# Patient Record
Sex: Male | Born: 1935 | Race: White | Hispanic: No | Marital: Married | Smoking: Former smoker
Health system: Southern US, Community
[De-identification: ages and names within clinical notes are randomized; demographics above are authoritative.]

## PROBLEM LIST (undated history)

## (undated) DIAGNOSIS — G629 Polyneuropathy, unspecified: Secondary | ICD-10-CM

## (undated) DIAGNOSIS — I10 Essential (primary) hypertension: Secondary | ICD-10-CM

## (undated) DIAGNOSIS — E119 Type 2 diabetes mellitus without complications: Secondary | ICD-10-CM

## (undated) HISTORY — PX: CATARACT EXTRACTION, BILATERAL: SHX1313

---

## 2012-08-20 ENCOUNTER — Ambulatory Visit: Payer: Self-pay | Admitting: Ophthalmology

## 2012-09-10 ENCOUNTER — Ambulatory Visit: Payer: Self-pay | Admitting: Ophthalmology

## 2016-11-29 DIAGNOSIS — I208 Other forms of angina pectoris: Secondary | ICD-10-CM | POA: Insufficient documentation

## 2016-11-29 DIAGNOSIS — E782 Mixed hyperlipidemia: Secondary | ICD-10-CM | POA: Insufficient documentation

## 2016-11-29 DIAGNOSIS — I10 Essential (primary) hypertension: Secondary | ICD-10-CM | POA: Insufficient documentation

## 2016-12-02 ENCOUNTER — Emergency Department: Payer: Medicare Other

## 2016-12-02 ENCOUNTER — Observation Stay: Payer: Medicare Other

## 2016-12-02 ENCOUNTER — Encounter: Payer: Self-pay | Admitting: Emergency Medicine

## 2016-12-02 ENCOUNTER — Observation Stay
Admission: EM | Admit: 2016-12-02 | Discharge: 2016-12-03 | Disposition: A | Discharge status: Home / Self Care | Payer: Medicare Other | Attending: Internal Medicine | Admitting: Internal Medicine

## 2016-12-02 DIAGNOSIS — J90 Pleural effusion, not elsewhere classified: Secondary | ICD-10-CM | POA: Insufficient documentation

## 2016-12-02 DIAGNOSIS — Z794 Long term (current) use of insulin: Secondary | ICD-10-CM | POA: Diagnosis not present

## 2016-12-02 DIAGNOSIS — Z79899 Other long term (current) drug therapy: Secondary | ICD-10-CM | POA: Insufficient documentation

## 2016-12-02 DIAGNOSIS — N281 Cyst of kidney, acquired: Secondary | ICD-10-CM | POA: Diagnosis not present

## 2016-12-02 DIAGNOSIS — K4020 Bilateral inguinal hernia, without obstruction or gangrene, not specified as recurrent: Secondary | ICD-10-CM | POA: Insufficient documentation

## 2016-12-02 DIAGNOSIS — C7970 Secondary malignant neoplasm of unspecified adrenal gland: Secondary | ICD-10-CM | POA: Diagnosis not present

## 2016-12-02 DIAGNOSIS — R0602 Shortness of breath: Secondary | ICD-10-CM | POA: Diagnosis present

## 2016-12-02 DIAGNOSIS — E43 Unspecified severe protein-calorie malnutrition: Secondary | ICD-10-CM | POA: Diagnosis not present

## 2016-12-02 DIAGNOSIS — C787 Secondary malignant neoplasm of liver and intrahepatic bile duct: Principal | ICD-10-CM | POA: Insufficient documentation

## 2016-12-02 DIAGNOSIS — I700 Atherosclerosis of aorta: Secondary | ICD-10-CM | POA: Diagnosis not present

## 2016-12-02 DIAGNOSIS — C7951 Secondary malignant neoplasm of bone: Secondary | ICD-10-CM | POA: Insufficient documentation

## 2016-12-02 DIAGNOSIS — Z7982 Long term (current) use of aspirin: Secondary | ICD-10-CM | POA: Diagnosis not present

## 2016-12-02 DIAGNOSIS — R5383 Other fatigue: Secondary | ICD-10-CM

## 2016-12-02 DIAGNOSIS — K5730 Diverticulosis of large intestine without perforation or abscess without bleeding: Secondary | ICD-10-CM | POA: Diagnosis not present

## 2016-12-02 DIAGNOSIS — N400 Enlarged prostate without lower urinary tract symptoms: Secondary | ICD-10-CM | POA: Insufficient documentation

## 2016-12-02 DIAGNOSIS — I119 Hypertensive heart disease without heart failure: Secondary | ICD-10-CM | POA: Insufficient documentation

## 2016-12-02 DIAGNOSIS — R0789 Other chest pain: Secondary | ICD-10-CM

## 2016-12-02 DIAGNOSIS — E1142 Type 2 diabetes mellitus with diabetic polyneuropathy: Secondary | ICD-10-CM | POA: Insufficient documentation

## 2016-12-02 DIAGNOSIS — Z87891 Personal history of nicotine dependence: Secondary | ICD-10-CM | POA: Diagnosis not present

## 2016-12-02 DIAGNOSIS — R142 Eructation: Secondary | ICD-10-CM

## 2016-12-02 DIAGNOSIS — R160 Hepatomegaly, not elsewhere classified: Secondary | ICD-10-CM

## 2016-12-02 DIAGNOSIS — R531 Weakness: Secondary | ICD-10-CM

## 2016-12-02 DIAGNOSIS — K219 Gastro-esophageal reflux disease without esophagitis: Secondary | ICD-10-CM | POA: Diagnosis not present

## 2016-12-02 DIAGNOSIS — R109 Unspecified abdominal pain: Secondary | ICD-10-CM

## 2016-12-02 HISTORY — DX: Type 2 diabetes mellitus without complications: E11.9

## 2016-12-02 HISTORY — DX: Essential (primary) hypertension: I10

## 2016-12-02 HISTORY — DX: Polyneuropathy, unspecified: G62.9

## 2016-12-02 LAB — COMPREHENSIVE METABOLIC PANEL
ALK PHOS: 55 U/L (ref 38–126)
ALT: 18 U/L (ref 17–63)
ANION GAP: 8 (ref 5–15)
AST: 26 U/L (ref 15–41)
Albumin: 3.3 g/dL — ABNORMAL LOW (ref 3.5–5.0)
BILIRUBIN TOTAL: 0.6 mg/dL (ref 0.3–1.2)
BUN: 40 mg/dL — ABNORMAL HIGH (ref 6–20)
CALCIUM: 9 mg/dL (ref 8.9–10.3)
CO2: 26 mmol/L (ref 22–32)
CREATININE: 1.72 mg/dL — AB (ref 0.61–1.24)
Chloride: 105 mmol/L (ref 101–111)
GFR, EST AFRICAN AMERICAN: 41 mL/min — AB (ref >60)
GFR, EST NON AFRICAN AMERICAN: 36 mL/min — AB (ref >60)
Glucose, Bld: 189 mg/dL — ABNORMAL HIGH (ref 65–99)
Potassium: 3.4 mmol/L — ABNORMAL LOW (ref 3.5–5.1)
SODIUM: 139 mmol/L (ref 135–145)
TOTAL PROTEIN: 6.7 g/dL (ref 6.5–8.1)

## 2016-12-02 LAB — GLUCOSE, CAPILLARY
GLUCOSE-CAPILLARY: 135 mg/dL — AB (ref 65–99)
GLUCOSE-CAPILLARY: 182 mg/dL — AB (ref 65–99)

## 2016-12-02 LAB — URINALYSIS, COMPLETE (UACMP) WITH MICROSCOPIC
Bacteria, UA: NONE SEEN
Bilirubin Urine: NEGATIVE
Glucose, UA: NEGATIVE mg/dL
Hgb urine dipstick: NEGATIVE
KETONES UR: NEGATIVE mg/dL
Nitrite: NEGATIVE
PH: 5 (ref 5.0–8.0)
PROTEIN: NEGATIVE mg/dL
SQUAMOUS EPITHELIAL / LPF: NONE SEEN
Specific Gravity, Urine: 1.036 — ABNORMAL HIGH (ref 1.005–1.030)

## 2016-12-02 LAB — CBC
HEMATOCRIT: 28.3 % — AB (ref 40.0–52.0)
HEMOGLOBIN: 9.7 g/dL — AB (ref 13.0–18.0)
MCH: 30.3 pg (ref 26.0–34.0)
MCHC: 34.4 g/dL (ref 32.0–36.0)
MCV: 88.3 fL (ref 80.0–100.0)
Platelets: 230 10*3/uL (ref 150–440)
RBC: 3.21 MIL/uL — AB (ref 4.40–5.90)
RDW: 13.1 % (ref 11.5–14.5)
WBC: 12.1 10*3/uL — ABNORMAL HIGH (ref 3.8–10.6)

## 2016-12-02 LAB — BRAIN NATRIURETIC PEPTIDE: B Natriuretic Peptide: 122 pg/mL — ABNORMAL HIGH (ref 0.0–100.0)

## 2016-12-02 LAB — LIPASE, BLOOD: Lipase: 86 U/L — ABNORMAL HIGH (ref 11–51)

## 2016-12-02 LAB — TROPONIN I

## 2016-12-02 MED ORDER — PANTOPRAZOLE SODIUM 40 MG PO TBEC
40.0000 mg | DELAYED_RELEASE_TABLET | Freq: Every day | ORAL | Status: DC
  Administered 2016-12-03: 09:00:00 40 mg via ORAL
  Filled 2016-12-02: qty 1

## 2016-12-02 MED ORDER — SODIUM CHLORIDE 0.9 % IV SOLN
Freq: Once | INTRAVENOUS | Status: AC
  Administered 2016-12-02: 17:00:00 via INTRAVENOUS

## 2016-12-02 MED ORDER — IOPAMIDOL (ISOVUE-300) INJECTION 61%
75.0000 mL | Freq: Once | INTRAVENOUS | Status: AC | PRN
  Administered 2016-12-02: 18:00:00 75 mL via INTRAVENOUS

## 2016-12-02 MED ORDER — GABAPENTIN 600 MG PO TABS
600.0000 mg | ORAL_TABLET | Freq: Three times a day (TID) | ORAL | Status: DC | PRN
  Administered 2016-12-02 – 2016-12-03 (×3): 600 mg via ORAL
  Filled 2016-12-02 (×3): qty 1

## 2016-12-02 MED ORDER — ONDANSETRON HCL 4 MG/2ML IJ SOLN: 4.0000 mg | Freq: Four times a day (QID) | INTRAMUSCULAR | Status: DC | PRN

## 2016-12-02 MED ORDER — IOPAMIDOL (ISOVUE-300) INJECTION 61%
15.0000 mL | INTRAVENOUS | Status: AC
  Administered 2016-12-02: 15 mL via ORAL

## 2016-12-02 MED ORDER — ATORVASTATIN CALCIUM 20 MG PO TABS: 10.0000 mg | ORAL_TABLET | Freq: Every evening | ORAL | Status: DC

## 2016-12-02 MED ORDER — ENOXAPARIN SODIUM 40 MG/0.4ML ~~LOC~~ SOLN
40.0000 mg | SUBCUTANEOUS | Status: DC
  Administered 2016-12-02: 22:00:00 40 mg via SUBCUTANEOUS
  Filled 2016-12-02: qty 0.4

## 2016-12-02 MED ORDER — ONDANSETRON HCL 4 MG PO TABS: 4.0000 mg | ORAL_TABLET | Freq: Four times a day (QID) | ORAL | Status: DC | PRN

## 2016-12-02 MED ORDER — SODIUM CHLORIDE 0.9% FLUSH
3.0000 mL | Freq: Two times a day (BID) | INTRAVENOUS | Status: DC
  Administered 2016-12-02 – 2016-12-03 (×2): 3 mL via INTRAVENOUS

## 2016-12-02 MED ORDER — ACETAMINOPHEN 325 MG PO TABS: 650.0000 mg | ORAL_TABLET | Freq: Four times a day (QID) | ORAL | Status: DC | PRN

## 2016-12-02 MED ORDER — INSULIN ASPART 100 UNIT/ML ~~LOC~~ SOLN
0.0000 [IU] | Freq: Three times a day (TID) | SUBCUTANEOUS | Status: DC
  Administered 2016-12-02: 1 [IU] via SUBCUTANEOUS
  Administered 2016-12-03 (×2): 2 [IU] via SUBCUTANEOUS
  Filled 2016-12-02 (×3): qty 1

## 2016-12-02 MED ORDER — POLYETHYLENE GLYCOL 3350 17 G PO PACK: 17.0000 g | PACK | Freq: Every day | ORAL | Status: DC | PRN

## 2016-12-02 MED ORDER — SODIUM CHLORIDE 0.9% FLUSH: 3.0000 mL | INTRAVENOUS | Status: DC | PRN

## 2016-12-02 MED ORDER — ASPIRIN 81 MG PO CHEW
81.0000 mg | CHEWABLE_TABLET | Freq: Every day | ORAL | Status: DC
  Administered 2016-12-02 – 2016-12-03 (×2): 81 mg via ORAL
  Filled 2016-12-02 (×2): qty 1

## 2016-12-02 MED ORDER — ACETAMINOPHEN 650 MG RE SUPP: 650.0000 mg | Freq: Four times a day (QID) | RECTAL | Status: DC | PRN

## 2016-12-02 NOTE — H&P (Signed)
[facility] NAME: Keith Chandler    MR#:  [id number]  DATE OF BIRTH:  [birth year]  DATE OF ADMISSION:  12/02/2016  PRIMARY CARE PHYSICIAN: [facility] [place]   REQUESTING/REFERRING PHYSICIAN: Dr Jacqualine Code  CHIEF COMPLAINT:  Chest pressure with gas feeling in the epigastric area and significant amount of flatus, loss of appetite weight loss 15 pounds over one month  HISTORY OF PRESENT ILLNESS:  Keith Chandler  is a 82 y.o. male with a known history of  Dm-2, HTN and history of peripheral neuropathy comes to the emergency room with feeling of epigastric discomfort due to "gas" and significant amount of flatus with weight loss 15 pounds loss of appetite and generalized weakness for past 1 month. Patient was recently seen by cardiology Dr. Nehemiah Massed per family and his blood pressure medications have been adjusted since his blood pressure is staying on the lower side. Patient denies any chest pain. Denies any nausea or vomiting. In the emergency room patient underwent ultrasound of the abdomen shows multiple enhancing lesions in the liver consistent with possible metastatic lesions.  Patient is being admitted for further evaluation of management.  PAST MEDICAL HISTORY:   Past Medical History:  Diagnosis Date  . Diabetes mellitus without complication ([place])   . Hypertension   . Neuropathy     PAST SURGICAL HISTOIRY:  History reviewed. No pertinent surgical history.  SOCIAL HISTORY:   Social History  Substance Use Topics  . Smoking status: Former Research scientist (life sciences)  . Smokeless tobacco: Never Used  . Alcohol use No    FAMILY HISTORY:  No family history on file.  DRUG ALLERGIES:   Allergies  Allergen Reactions  . Sulfa Antibiotics     REVIEW OF SYSTEMS:  Review of Systems  Constitutional: Positive for weight loss. Negative for chills and fever.  HENT: Negative for ear discharge, ear pain and nosebleeds.   Eyes: Negative  for blurred vision, pain and discharge.  Respiratory: Negative for sputum production, shortness of breath, wheezing and stridor.   Cardiovascular: Negative for chest pain, palpitations, orthopnea and PND.       Bloating and chest pressure with belching  Gastrointestinal: Negative for abdominal pain, diarrhea, nausea and vomiting.  Genitourinary: Negative for frequency and urgency.  Musculoskeletal: Negative for back pain and joint pain.  Neurological: Positive for weakness. Negative for sensory change, speech change and focal weakness.  Psychiatric/Behavioral: Negative for depression and hallucinations. The patient is not nervous/anxious.      MEDICATIONS AT HOME:   Prior to Admission medications   Medication Sig Start Date End Date Taking? Authorizing Provider  aspirin EC 81 MG tablet Take 81 mg by mouth daily.   Yes [provider]  atorvastatin (LIPITOR) 10 MG tablet Take 10 mg by mouth daily.   Yes [provider]  gabapentin (NEURONTIN) 600 MG tablet Take 600 mg by mouth 3 (three) times daily as needed.   Yes [provider]  omeprazole (PRILOSEC) 20 MG capsule Take 20 mg by mouth daily.   Yes [provider]  triamterene-hydrochlorothiazide (DYAZIDE) 37.5-25 MG capsule Take 1 capsule by mouth daily.   Yes [provider]      VITAL SIGNS:  Blood pressure (!) 107/50, pulse 98, temperature 98.6 F (37 C), temperature source Oral, resp. rate 18, height 5\' 6"  (1.676 m), weight 83 kg (183 lb), SpO2 92 %.  PHYSICAL EXAMINATION:  GENERAL:  82 y.o.-year-old patient lying in the bed with  no acute distress.  EYES: Pupils equal, round, reactive to light and accommodation. No scleral icterus. Extraocular muscles intact.  HEENT: Head atraumatic, normocephalic. Oropharynx and nasopharynx clear.  NECK:  Supple, no jugular venous distention. No thyroid enlargement, no tenderness.  LUNGS: Normal breath sounds bilaterally, no wheezing, rales,rhonchi or  crepitation. No use of accessory muscles of respiration.  CARDIOVASCULAR: S1, S2 normal. No murmurs, rubs, or gallops.  ABDOMEN: Soft, nontender, nondistended. Bowel sounds present. No organomegaly or mass.  EXTREMITIES: No pedal edema, cyanosis, or clubbing.  NEUROLOGIC: Cranial nerves II through XII are intact. Muscle strength 5/5 in all extremities. Sensation intact. Gait not checked.  PSYCHIATRIC: The patient is alert and oriented x 3.  SKIN: No obvious rash, lesion, or ulcer.   LABORATORY PANEL:   CBC  Recent Labs Lab 12/02/16 1242  WBC 12.1*  HGB 9.7*  HCT 28.3*  PLT 230   ------------------------------------------------------------------------------------------------------------------  Chemistries   Recent Labs Lab 12/02/16 1242  NA 139  K 3.4*  CL 105  CO2 26  GLUCOSE 189*  BUN 40*  CREATININE 1.72*  CALCIUM 9.0  AST 26  ALT 18  ALKPHOS 55  BILITOT 0.6   ------------------------------------------------------------------------------------------------------------------  Cardiac Enzymes  Recent Labs Lab 12/02/16 1242  TROPONINI <0.03   ------------------------------------------------------------------------------------------------------------------  RADIOLOGY:  Dg Chest 2 View  Result Date: 12/02/2016 CLINICAL DATA:  Fatigue, weakness, chest pressure and cough EXAM: CHEST  2 VIEW COMPARISON:  None. FINDINGS: Mild cardiomegaly with vascular congestion. Chronic nonspecific interstitial prominence without definite edema. No significant effusion or pneumothorax. No focal pneumonia, collapse or consolidation. Trachea is midline. Atherosclerosis of aorta. Degenerative changes of the spine. No compression fracture. IMPRESSION: Cardiomegaly with vascular congestion Mild chronic interstitial changes suspected. No definite superimposed CHF or pneumonia. Electronically Signed   By: Jerilynn Mages.  Shick M.D.   On: 12/02/2016 12:51   US Abdomen Limited Ruq  Result Date:  12/02/2016 CLINICAL DATA:  Abdominal pain and belching for the past month. 15 pound weight loss over the past month. Ex-smoker. EXAM: ULTRASOUND ABDOMEN LIMITED RIGHT UPPER QUADRANT COMPARISON:  None. FINDINGS: Gallbladder: No gallstones or wall thickening visualized. No sonographic Murphy sign noted by sonographer. Common bile duct: Diameter: 3.7 mm Liver: Multiple rounded and oval hypoechoic masses within the liver. The largest mass measures 1.5 cm in maximum diameter. IMPRESSION: Multiple liver masses, most likely representing metastatic disease. Otherwise, normal examination. Electronically Signed   By: Claudie Revering M.D.   On: 12/02/2016 14:31    EKG:   Sinus rhythm with possible old inferior wall ST-T changes  IMPRESSION AND PLAN:   Keith Chandler  is a 82 y.o. male with a known history of  Dm-2, HTN and history of peripheral neuropathy comes to the emergency room with feeling of epigastric discomfort due to "gas" and significant amount of flatus with weight loss 15 pounds loss of appetite and generalized weakness for past 1 month.  1. Generalized weakness, loss of appetite, weight loss 15 pounds along with epigastric discomfort and abnormal ultrasound abdomen with metastatic liver lesions worrisome for possible malignancy origin unknown -Admit for observation -Aspirin 81 mg daily. -CT abdomen and pelvis and CT chest with contrast -IV fluids  2. Chest pressure with gaseous feeling in the epigastric area worrisome for GERD, rule out MI -Troponin 3 -Aspirin 81 mg daily -Protonix 40 mg daily -When necessary Maalox -Consider GI workup as outpatient if symptoms do not improve  3. Type 2 diabetes diet-controlled -Sliding-scale insulin  4. Hypertension -Blood pressure stable hold BP  meds  5. Hyperlipidemia -On Lipitor  6. DVT prophylaxis subcutaneous Lovenox  Discussed with patient's children in the emergency room All the records are reviewed and case discussed with ED  provider. Management plans discussed with the patient, family and they are in agreement.  CODE STATUS: Full  TOTAL TIME TAKING CARE OF THIS PATIENT: 50 minutes.    Smith Potenza M.D on 12/02/2016 at 3:26 PM  Between 7am to 6pm - Pager - [phone]  After 6pm go to [email or web address] - password [email or web address] [facility]  [facility]  Office  [phone]  CC: Primary care physician; [facility]

## 2016-12-02 NOTE — Consult Note (Signed)
[facility] Consultation Note  Patient ID: Keith Chandler, MRN: [id number], DOB/AGE: 1934-10-07 82 y.o. Admit date: 12/02/2016   Date of Consult: 12/02/2016 Primary Physician: [facility] [place] Primary Cardiologist: Nehemiah Massed  Chief Complaint:  Chief Complaint  Patient presents with  . Fatigue  . Cough   Reason for Consult: chest pain weakness fatigue  HPI: 82 y.o. male with known diabetes without complication essential hypertension mixed hyperlipidemia who is had progressive shortness of breath weakness fatigue and unable to do the things he wishes. He's had an chest x-ray which showed some pleural effusions and some apparent nodules of unknown etiology. He did have some blood work showing slight elevation of white blood cell count but no other abnormalities. EKG shows no normal sinus rhythm. He does have this chest discomfort which is nonspecific in nature and is atypical for coronary artery disease in true angina. The patient is a normal troponin and no evidence of myocardial infarction. In addition to that to further evaluation of his chest and abdomen by CAT scan shows significant extensive lytic lesions of the spine lungs mediastinum and abdomen consistent with metastatic cancer. An no apparent significant cardiac involvement  Past Medical History:  Diagnosis Date  . Diabetes mellitus without complication ([place])   . Hypertension   . Neuropathy       Surgical History: History reviewed. No pertinent surgical history.   Home Meds: Prior to Admission medications   Medication Sig Start Date End Date Taking? Authorizing Provider  aspirin EC 81 MG tablet Take 81 mg by mouth daily.   Yes [provider]  atorvastatin (LIPITOR) 10 MG tablet Take 10 mg by mouth daily.   Yes [provider]  gabapentin (NEURONTIN) 600 MG tablet Take 600 mg by mouth 3 (three) times daily as needed.   Yes [provider]  omeprazole (PRILOSEC) 20 MG capsule  Take 20 mg by mouth daily.   Yes [provider]  triamterene-hydrochlorothiazide (DYAZIDE) 37.5-25 MG capsule Take 1 capsule by mouth daily.   Yes [provider]    Inpatient Medications:  . aspirin  81 mg Oral Daily  . atorvastatin  10 mg Oral QPM  . enoxaparin (LOVENOX) injection  40 mg Subcutaneous Q24H  . insulin aspart  0-9 Units Subcutaneous TID WC  . pantoprazole  40 mg Oral Daily  . sodium chloride flush  3 mL Intravenous Q12H     Allergies:  Allergies  Allergen Reactions  . Sulfa Antibiotics     Social History   Social History  . Marital status: Married    Spouse name: N/A  . Number of children: N/A  . Years of education: N/A   Occupational History  . Not on file.   Social History Main Topics  . Smoking status: Former Research scientist (life sciences)  . Smokeless tobacco: Never Used  . Alcohol use No  . Drug use: No  . Sexual activity: Not on file   Other Topics Concern  . Not on file   Social History Narrative  . No narrative on file     No family history on file.   Review of Systems Positive for weakness fatigue weight changes Negative for: General:  chills, fever, night sweats or positive for weight changes.  Cardiovascular: PND orthopnea syncope dizziness  Dermatological skin lesions rashes Respiratory: Cough congestion Urologic: Frequent urination urination at night and hematuria Abdominal: negative for nausea, vomiting, diarrhea, bright red blood per rectum, melena, or hematemesis Neurologic: negative for visual changes, and/or  hearing changes  All other systems reviewed and are otherwise negative except as noted above.  Labs:  Recent Labs  12/02/16 1242  TROPONINI <0.03   Lab Results  Component Value Date   WBC 12.1 (H) 12/02/2016   HGB 9.7 (L) 12/02/2016   HCT 28.3 (L) 12/02/2016   MCV 88.3 12/02/2016   PLT 230 12/02/2016    Recent Labs Lab 12/02/16 1242  NA 139  K 3.4*  CL 105  CO2 26  BUN 40*  CREATININE 1.72*  CALCIUM  9.0  PROT 6.7  BILITOT 0.6  ALKPHOS 55  ALT 18  AST 26  GLUCOSE 189*   No results found for: CHOL, HDL, LDLCALC, TRIG No results found for: DDIMER  Radiology/Studies:  Dg Chest 2 View  Result Date: 12/02/2016 CLINICAL DATA:  Fatigue, weakness, chest pressure and cough EXAM: CHEST  2 VIEW COMPARISON:  None. FINDINGS: Mild cardiomegaly with vascular congestion. Chronic nonspecific interstitial prominence without definite edema. No significant effusion or pneumothorax. No focal pneumonia, collapse or consolidation. Trachea is midline. Atherosclerosis of aorta. Degenerative changes of the spine. No compression fracture. IMPRESSION: Cardiomegaly with vascular congestion Mild chronic interstitial changes suspected. No definite superimposed CHF or pneumonia. Electronically Signed   By: Jerilynn Mages.  Shick M.D.   On: 12/02/2016 12:51   Ct Chest W Contrast  Result Date: 12/02/2016 CLINICAL DATA:  Abnormal ultrasound showing liver masses, epigastric discomfort due to gas, lays, 15 pounds weight loss, loss of appetite, generalized weakness, type II diabetes mellitus, hypertension, former smoker EXAM: CT CHEST, ABDOMEN, AND PELVIS WITH CONTRAST TECHNIQUE: Multidetector CT imaging of the chest, abdomen and pelvis was performed following the standard protocol during bolus administration of intravenous contrast. Sagittal and coronal MPR images reconstructed from axial data set. CONTRAST:  51mL ISOVUE-300 IOPAMIDOL (ISOVUE-300) INJECTION 61% IV. Dilute oral contrast. COMPARISON:  Abdominal ultrasound 12/02/2016 FINDINGS: CT CHEST FINDINGS Cardiovascular: Atherosclerotic calcifications aorta, coronary arteries, proximal great vessels. Aorta normal caliber. Small pericardial effusion. Mild LEFT ventricular dilatation. Displacement and compression of the IVC by mediastinal adenopathy the remains patent. Mediastinum/Nodes: Moderate-sized hiatal hernia question mild wall thickening of the midesophagus. Multiple enlarged  mediastinal lymph nodes including a conglomerate nodal mass at the AP window 3.3 x 4.6 cm, pretracheal node 16 mm image 21, prevascular node 16 mm image 16, RIGHT superior mediastinal node 21 mm image 14, and necrotic subcarinal node 29 mm image 28. 17 mm RIGHT hilar node image 31. RIGHT superior hilar node 16 mm image 27. Adenopathy extends throughout the superior mediastinum on the RIGHT, displacing and compressing the IVC and extending to the base of the RIGHT cervical region. No axillary adenopathy. Unable to assess patency of the RIGHT subclavian vein. Lungs/Pleura: Small BILATERAL pleural effusions. Abnormal soft tissue/tumor surrounds the RIGHT and LEFT mainstem bronchi. Underlying emphysematous changes. Dependent atelectasis in the posterior lower lobes. No definite pulmonary mass. 4 mm RIGHT upper lobe nodule image 33. 3 mm RIGHT upper lobe nodule image 63. Calcified granuloma anterior RIGHT lung. Few additional scattered tiny BILATERAL pulmonary nodules. 6 mm LEFT upper lobe nodule image 25. No infiltrate or pneumothorax. Musculoskeletal: No acute osseous lesions. CT ABDOMEN PELVIS FINDINGS Hepatobiliary: Subtle liver lesions question metastases including an 11 mm LEFT lobe lesion image 52, 9 mm RIGHT lobe lesion image 55, LEFT lobe lesion superiorly 8 mm image 50. Gallbladder unremarkable. Pancreas: Mild peripancreatic edema. Peripancreatic adenopathy. No discrete pancreatic mass or ductal dilatation. Spleen: Normal appearance Adrenals/Urinary Tract: BILATERAL adrenal nodules 17 x 12 mm LEFT and 9  x 6 mm RIGHT. LEFT renal cysts largest 4.5 x 3.4 cm image 72. Kidneys, ureters, and bladder otherwise normal appearance. Stomach/Bowel: Scattered stool throughout colon. Sigmoid diverticulosis without evidence of diverticulitis. No definite gastric or bowel mass. Normal appendix. Vascular/Lymphatic: Atherosclerotic calcifications aorta without aneurysm. Extensive adenopathy and mesenteric up to 15 mm short axis  image 78. Peripancreatic, periportal and portal caval adenopathy up to 19 mm short axis image 62 and 16 mm image 56. Reproductive: Significant prostatic enlargement gland measuring 6.1 x 5.4 cm image 113. Other: Small amount of free fluid in pelvis. Significant edema in the mesenteric associated with mesenteric adenopathy. Multiple tumor nodules/implants in retroperitoneal fat in the flanks bilaterally. Possible peritoneal nodules anterior pelvis image 100 and LEFT pelvis image 94. BILATERAL inguinal hernias containing fat. No free air. Musculoskeletal: No acute osseous findings. Lytic metastatic lesions at L2, L3, L5, LEFT iliac. Probable metastatic lesion S2 segment of sacrum. IMPRESSION: Extensive tumor involvement in the chest, abdomen, and pelvis, with extensive adenopathy in the mediastinum and RIGHT hilum, mesenteric, periportal/peripancreatic. Small BILATERAL pulmonary nodules question pulmonary metastases. Lytic metastatic lesions in lumbar spine and pelvis. Small hepatic and adrenal metastases as well as retroperitoneal tumor deposits bilaterally and question of peritoneal deposits in the pelvis. No definite primary tumor is localized. Small BILATERAL pleural effusions. Electronically Signed   By: Lavonia Dana M.D.   On: 12/02/2016 18:27   Ct Abdomen Pelvis W Contrast  Result Date: 12/02/2016 CLINICAL DATA:  Abnormal ultrasound showing liver masses, epigastric discomfort due to gas, lays, 15 pounds weight loss, loss of appetite, generalized weakness, type II diabetes mellitus, hypertension, former smoker EXAM: CT CHEST, ABDOMEN, AND PELVIS WITH CONTRAST TECHNIQUE: Multidetector CT imaging of the chest, abdomen and pelvis was performed following the standard protocol during bolus administration of intravenous contrast. Sagittal and coronal MPR images reconstructed from axial data set. CONTRAST:  23mL ISOVUE-300 IOPAMIDOL (ISOVUE-300) INJECTION 61% IV. Dilute oral contrast. COMPARISON:  Abdominal  ultrasound 12/02/2016 FINDINGS: CT CHEST FINDINGS Cardiovascular: Atherosclerotic calcifications aorta, coronary arteries, proximal great vessels. Aorta normal caliber. Small pericardial effusion. Mild LEFT ventricular dilatation. Displacement and compression of the IVC by mediastinal adenopathy the remains patent. Mediastinum/Nodes: Moderate-sized hiatal hernia question mild wall thickening of the midesophagus. Multiple enlarged mediastinal lymph nodes including a conglomerate nodal mass at the AP window 3.3 x 4.6 cm, pretracheal node 16 mm image 21, prevascular node 16 mm image 16, RIGHT superior mediastinal node 21 mm image 14, and necrotic subcarinal node 29 mm image 28. 17 mm RIGHT hilar node image 31. RIGHT superior hilar node 16 mm image 27. Adenopathy extends throughout the superior mediastinum on the RIGHT, displacing and compressing the IVC and extending to the base of the RIGHT cervical region. No axillary adenopathy. Unable to assess patency of the RIGHT subclavian vein. Lungs/Pleura: Small BILATERAL pleural effusions. Abnormal soft tissue/tumor surrounds the RIGHT and LEFT mainstem bronchi. Underlying emphysematous changes. Dependent atelectasis in the posterior lower lobes. No definite pulmonary mass. 4 mm RIGHT upper lobe nodule image 33. 3 mm RIGHT upper lobe nodule image 63. Calcified granuloma anterior RIGHT lung. Few additional scattered tiny BILATERAL pulmonary nodules. 6 mm LEFT upper lobe nodule image 25. No infiltrate or pneumothorax. Musculoskeletal: No acute osseous lesions. CT ABDOMEN PELVIS FINDINGS Hepatobiliary: Subtle liver lesions question metastases including an 11 mm LEFT lobe lesion image 52, 9 mm RIGHT lobe lesion image 55, LEFT lobe lesion superiorly 8 mm image 50. Gallbladder unremarkable. Pancreas: Mild peripancreatic edema. Peripancreatic adenopathy. No discrete pancreatic mass  or ductal dilatation. Spleen: Normal appearance Adrenals/Urinary Tract: BILATERAL adrenal nodules 17  x 12 mm LEFT and 9 x 6 mm RIGHT. LEFT renal cysts largest 4.5 x 3.4 cm image 72. Kidneys, ureters, and bladder otherwise normal appearance. Stomach/Bowel: Scattered stool throughout colon. Sigmoid diverticulosis without evidence of diverticulitis. No definite gastric or bowel mass. Normal appendix. Vascular/Lymphatic: Atherosclerotic calcifications aorta without aneurysm. Extensive adenopathy and mesenteric up to 15 mm short axis image 78. Peripancreatic, periportal and portal caval adenopathy up to 19 mm short axis image 62 and 16 mm image 56. Reproductive: Significant prostatic enlargement gland measuring 6.1 x 5.4 cm image 113. Other: Small amount of free fluid in pelvis. Significant edema in the mesenteric associated with mesenteric adenopathy. Multiple tumor nodules/implants in retroperitoneal fat in the flanks bilaterally. Possible peritoneal nodules anterior pelvis image 100 and LEFT pelvis image 94. BILATERAL inguinal hernias containing fat. No free air. Musculoskeletal: No acute osseous findings. Lytic metastatic lesions at L2, L3, L5, LEFT iliac. Probable metastatic lesion S2 segment of sacrum. IMPRESSION: Extensive tumor involvement in the chest, abdomen, and pelvis, with extensive adenopathy in the mediastinum and RIGHT hilum, mesenteric, periportal/peripancreatic. Small BILATERAL pulmonary nodules question pulmonary metastases. Lytic metastatic lesions in lumbar spine and pelvis. Small hepatic and adrenal metastases as well as retroperitoneal tumor deposits bilaterally and question of peritoneal deposits in the pelvis. No definite primary tumor is localized. Small BILATERAL pleural effusions. Electronically Signed   By: Lavonia Dana M.D.   On: 12/02/2016 18:27   US Abdomen Limited Ruq  Result Date: 12/02/2016 CLINICAL DATA:  Abdominal pain and belching for the past month. 15 pound weight loss over the past month. Ex-smoker. EXAM: ULTRASOUND ABDOMEN LIMITED RIGHT UPPER QUADRANT COMPARISON:  None.  FINDINGS: Gallbladder: No gallstones or wall thickening visualized. No sonographic Murphy sign noted by sonographer. Common bile duct: Diameter: 3.7 mm Liver: Multiple rounded and oval hypoechoic masses within the liver. The largest mass measures 1.5 cm in maximum diameter. IMPRESSION: Multiple liver masses, most likely representing metastatic disease. Otherwise, normal examination. Electronically Signed   By: Claudie Revering M.D.   On: 12/02/2016 14:31    EKG: Normal sinus rhythm  Weights: Filed Weights   12/02/16 1200 12/02/16 1600  Weight: 83 kg (183 lb) 82.1 kg (181 lb)     Physical Exam: Blood pressure (!) 138/56, pulse 86, temperature 97.9 F (36.6 C), temperature source Oral, resp. rate 18, height 5\' 6"  (1.676 m), weight 82.1 kg (181 lb), SpO2 95 %. Body mass index is 29.21 kg/m. General: Well developed, well nourished, in no acute distress. Head eyes ears nose throat: Normocephalic, atraumatic, sclera non-icteric, no xanthomas, nares are without discharge. No apparent thyromegaly and/or mass  Lungs: Normal respiratory effort. Few wheezes, no rales, some rhonchi.  Heart: RRR with normal S1 S2. no murmur gallop, no rub, PMI is normal size and placement, carotid upstroke normal without bruit, jugular venous pressure is normal Abdomen: Soft, non-tender, non-distended with normoactive bowel sounds. No hepatomegaly. No rebound/guarding. No obvious abdominal masses. Abdominal aorta is normal size without bruit Extremities: No edema. no cyanosis, no clubbing, no ulcers  Peripheral : 2+ bilateral upper extremity pulses, 2+ bilateral femoral pulses, 2+ bilateral dorsal pedal pulse Neuro: Alert and oriented. No facial asymmetry. No focal deficit. Moves all extremities spontaneously. Musculoskeletal: Normal muscle tone without kyphosis Psych:  Responds to questions appropriately with a normal affect.    Assessment: 82 year old male with the essential hypertension mixed hyperlipidemia on a  previous appropriate medication management having nonspecific  chest discomfort with a normal EKG and no evidence of myocardial infarction with extensive changes by CAT scan suggesting metastatic disease  Plan: 1. No further cardiac workup at this time due to atypical symptoms for cardiac disease 2. Continue medication management for hypertension control 3. Further delineation of the type of metastatic disease and treatment options  Signed, Corey Skains M.D. [facility] 12/02/2016, 6:57 PM

## 2016-12-02 NOTE — ED Provider Notes (Signed)
[facility] Emergency Department Provider Note   ____________________________________________   First MD Initiated Contact with Patient 12/02/16 1217     (approximate)  I have reviewed the triage vital signs and the nursing notes.   HISTORY  Chief Complaint Fatigue and Cough    HPI Keith Chandler is a 82 y.o. male who is from about one month now noticed his voice seems slightly hoarse, he is occasionally had a dry cough, but he is also been experiencing episodes of what he describes as a pressure or a gas feeling in the center of his chest. This comes and goes, seems to be getting slightly worse. Not seemingly associated with exertion.  Reports increasing fatigue. Occasionally short of breath  Saw his primary doctor and was placed on 10 days of Levaquin with no improvement. Saw cardiology and was instructed he needed to have an echocardiogram and stress test. Then went to urgent care, reports they told to come emergency room though he is not sure why on Friday night. He came today as he is continuing to have episodes of discomfort in the central chest occasional slight shortness of breath and fatigue.  Currently reports pain and symptom-free  Past Medical History:  Diagnosis Date  . Diabetes mellitus without complication ([place])   . Hypertension   . Neuropathy     Patient Active Problem List   Diagnosis Date Noted  . Weakness 12/02/2016    History reviewed. No pertinent surgical history.  Prior to Admission medications   Medication Sig Start Date End Date Taking? Authorizing Provider  aspirin EC 81 MG tablet Take 81 mg by mouth daily.   Yes [provider]  atorvastatin (LIPITOR) 10 MG tablet Take 10 mg by mouth daily.   Yes [provider]  gabapentin (NEURONTIN) 600 MG tablet Take 600 mg by mouth 3 (three) times daily as needed.   Yes [provider]  omeprazole (PRILOSEC) 20 MG capsule Take 20 mg by mouth daily.   Yes  [provider]  triamterene-hydrochlorothiazide (DYAZIDE) 37.5-25 MG capsule Take 1 capsule by mouth daily.   Yes [provider]    Allergies Sulfa antibiotics  No family history on file.  Social History Social History  Substance Use Topics  . Smoking status: Former Research scientist (life sciences)  . Smokeless tobacco: Never Used  . Alcohol use No    Review of Systems Constitutional: No fever/chillsBut having fatigue Eyes: No visual changes. ENT: No sore throat. Cardiovascular: See history of present illness Respiratory: See history of present illness Gastrointestinal: No abdominal pain.  No nausea, no vomiting.  No diarrhea.  No constipation. Genitourinary: Negative for dysuria. Musculoskeletal: Negative for back pain. Skin: Negative for rash. Neurological: Negative for headaches, focal weakness or numbness.    ____________________________________________   PHYSICAL EXAM:  VITAL SIGNS: ED Triage Vitals  Enc Vitals Group     BP 12/02/16 1203 (!) 107/50     Pulse Rate 12/02/16 1203 98     Resp 12/02/16 1203 18     Temp 12/02/16 1203 98.6 F (37 C)     Temp Source 12/02/16 1203 Oral     SpO2 12/02/16 1203 92 %     Weight 12/02/16 1200 183 lb (83 kg)     Height 12/02/16 1200 5\' 6"  (1.676 m)     Head Circumference --      Peak Flow --      Pain Score --      Pain Loc --  Pain Edu? --      Excl. in [place]? --     Constitutional: Alert and oriented. Well appearing and in no acute distress. Eyes: Conjunctivae are normal. Head: Atraumatic. Nose: No congestion/rhinnorhea. Mouth/Throat: Mucous membranes are moist. Neck: No stridor.   Cardiovascular: Normal rate, regular rhythm. Grossly normal heart sounds.  Good peripheral circulation. Respiratory: Normal respiratory effort.  No retractions. Lungs CTAB. Gastrointestinal: Soft and nontender. No distention. Musculoskeletal: No lower extremity tenderness nor edema. Neurologic:  Normal speech and language. No gross focal  neurologic deficits are appreciated.  Skin:  Skin is warm, dry and intact. No rash noted. Psychiatric: Mood and affect are normal. Speech and behavior are normal.  ____________________________________________   LABS (all labs ordered are listed, but only abnormal results are displayed)  Labs Reviewed  CBC - Abnormal; Notable for the following:       Result Value   WBC 12.1 (*)    RBC 3.21 (*)    Hemoglobin 9.7 (*)    HCT 28.3 (*)    All other components within normal limits  COMPREHENSIVE METABOLIC PANEL - Abnormal; Notable for the following:    Potassium 3.4 (*)    Glucose, Bld 189 (*)    BUN 40 (*)    Creatinine, Ser 1.72 (*)    Albumin 3.3 (*)    GFR calc non Af Amer 36 (*)    GFR calc Af Amer 41 (*)    All other components within normal limits  LIPASE, BLOOD - Abnormal; Notable for the following:    Lipase 86 (*)    All other components within normal limits  BRAIN NATRIURETIC PEPTIDE - Abnormal; Notable for the following:    B Natriuretic Peptide 122.0 (*)    All other components within normal limits  TROPONIN I  URINALYSIS, COMPLETE (UACMP) WITH MICROSCOPIC   ____________________________________________  EKG  Reviewed and interpreted by me at 1220 Heart rate 95 20 QTc 450 Normal sinus rhythm, nonspecific T-wave abnormality noted in inferior and lateral leads including slight depression in V5 and minimal inversion inferior ____________________________________________  RADIOLOGY  Dg Chest 2 View  Result Date: 12/02/2016 CLINICAL DATA:  Fatigue, weakness, chest pressure and cough EXAM: CHEST  2 VIEW COMPARISON:  None. FINDINGS: Mild cardiomegaly with vascular congestion. Chronic nonspecific interstitial prominence without definite edema. No significant effusion or pneumothorax. No focal pneumonia, collapse or consolidation. Trachea is midline. Atherosclerosis of aorta. Degenerative changes of the spine. No compression fracture. IMPRESSION: Cardiomegaly with  vascular congestion Mild chronic interstitial changes suspected. No definite superimposed CHF or pneumonia. Electronically Signed   By: Jerilynn Mages.  Shick M.D.   On: 12/02/2016 12:51   US Abdomen Limited Ruq  Result Date: 12/02/2016 CLINICAL DATA:  Abdominal pain and belching for the past month. 15 pound weight loss over the past month. Ex-smoker. EXAM: ULTRASOUND ABDOMEN LIMITED RIGHT UPPER QUADRANT COMPARISON:  None. FINDINGS: Gallbladder: No gallstones or wall thickening visualized. No sonographic Murphy sign noted by sonographer. Common bile duct: Diameter: 3.7 mm Liver: Multiple rounded and oval hypoechoic masses within the liver. The largest mass measures 1.5 cm in maximum diameter. IMPRESSION: Multiple liver masses, most likely representing metastatic disease. Otherwise, normal examination. Electronically Signed   By: Claudie Revering M.D.   On: 12/02/2016 14:31    ____________________________________________   PROCEDURES  Procedure(s) performed: None  Procedures  Critical Care performed: No  ____________________________________________   INITIAL IMPRESSION / ASSESSMENT AND PLAN / ED COURSE  Pertinent labs & imaging results that were available during  my care of the patient were reviewed by me and considered in my medical decision making (see chart for details).  Patient presents for increasing fatigue, also some associated chest symptoms including fracture or what he describes as a gas bubble feeling coming going with occasional shortness of breath. He had treatment for possible infectious symptoms about 2 weeks ago without improvement after 10 days of Levaquin. Saw cardiology recommended he have a echo and stress test but he is not really scheduled to almost a month later as now patient.  I'm concerned about his fatigue, episodes of shortness of breath and discomfort he is experiencing his chest. He also has forced voice which I am not quite clear on the etiology for. His oropharyngeal exam is  unremarkable  Time concerned given his outpatient evaluations that this could represent atypical presentation of acute coronary syndrome or angina. Does not carry a history of this. EKG does demonstrate T-wave abnormality has risk factors for coronary disease namely age, former smoker.  Heart score = 4 moderate risk.      ----------------------------------------- 4:03 PM on 12/02/2016 -----------------------------------------  Discussed with Dr. Nehemiah Massed. Advises reasonable to admit the patient for further workup at this time given he has attempted outpatient, this is his fourth visit in a week. Will see in consultation in the hospital. In addition, all  reveals concern for possible metastases or malignancy in the liver. Will admit patient for further evaluation and workup.   ____________________________________________   FINAL CLINICAL IMPRESSION(S) / ED DIAGNOSES  Final diagnoses:  Belching  Abdominal pain  Chest discomfort  Fatigue, unspecified type  Liver masses      NEW MEDICATIONS STARTED DURING THIS VISIT:  New Prescriptions   No medications on file     Note:  This document was prepared using Dragon voice recognition software and may include unintentional dictation errors.     Delman Kitten, MD 12/02/16 [phone]

## 2016-12-02 NOTE — ED Triage Notes (Signed)
Arrives with C/O cough x 3 weeks and weakness.  SEen through walk in clinic in early July-- had CXR and treated with Levaquin and tessalon pearls.  Then followed up with PCP, blood pressure was lower than normal.  One BP medication stopped.  Seen by Dr. Gigi Gin Thursday, had blood work-- WBC elevated.  Seen through walk in clinic on Friday, blood work repeated and patient was referred to ED.  Patient did not want to be seen through ED, but today is not feeling better.  Family report that patient has had a 15 pound weight loss over the past month.

## 2016-12-02 NOTE — Progress Notes (Signed)
Chaplain received an Order Requisition that patient requested prayer. Chaplain went and talked with patient and his family. Patient had been busy running test all day and will be running test again tomorrow. Chaplain prayed with patient and his wife, plus his children joined in. Patient just wants to get better so he can go home. Therefore that was our prayer. I gave the patient a prayer cloth.

## 2016-12-03 ENCOUNTER — Observation Stay
Admit: 2016-12-03 | Discharge: 2016-12-03 | Disposition: A | Discharge status: Home / Self Care | Payer: Medicare Other | Attending: Internal Medicine | Admitting: Internal Medicine

## 2016-12-03 ENCOUNTER — Other Ambulatory Visit: Payer: Self-pay | Admitting: Oncology

## 2016-12-03 DIAGNOSIS — Z87891 Personal history of nicotine dependence: Secondary | ICD-10-CM

## 2016-12-03 DIAGNOSIS — R498 Other voice and resonance disorders: Secondary | ICD-10-CM

## 2016-12-03 DIAGNOSIS — C799 Secondary malignant neoplasm of unspecified site: Secondary | ICD-10-CM | POA: Diagnosis not present

## 2016-12-03 DIAGNOSIS — Z794 Long term (current) use of insulin: Secondary | ICD-10-CM

## 2016-12-03 DIAGNOSIS — E119 Type 2 diabetes mellitus without complications: Secondary | ICD-10-CM

## 2016-12-03 DIAGNOSIS — R634 Abnormal weight loss: Secondary | ICD-10-CM | POA: Diagnosis not present

## 2016-12-03 DIAGNOSIS — R630 Anorexia: Secondary | ICD-10-CM | POA: Diagnosis not present

## 2016-12-03 DIAGNOSIS — R079 Chest pain, unspecified: Secondary | ICD-10-CM | POA: Diagnosis not present

## 2016-12-03 DIAGNOSIS — I10 Essential (primary) hypertension: Secondary | ICD-10-CM

## 2016-12-03 DIAGNOSIS — C787 Secondary malignant neoplasm of liver and intrahepatic bile duct: Secondary | ICD-10-CM | POA: Diagnosis not present

## 2016-12-03 DIAGNOSIS — G629 Polyneuropathy, unspecified: Secondary | ICD-10-CM

## 2016-12-03 DIAGNOSIS — C3490 Malignant neoplasm of unspecified part of unspecified bronchus or lung: Secondary | ICD-10-CM

## 2016-12-03 DIAGNOSIS — Z7982 Long term (current) use of aspirin: Secondary | ICD-10-CM

## 2016-12-03 DIAGNOSIS — Z79899 Other long term (current) drug therapy: Secondary | ICD-10-CM | POA: Diagnosis not present

## 2016-12-03 LAB — TROPONIN I

## 2016-12-03 LAB — GLUCOSE, CAPILLARY
Glucose-Capillary: 160 mg/dL — ABNORMAL HIGH (ref 65–99)
Glucose-Capillary: 159 mg/dL — ABNORMAL HIGH (ref 65–99)

## 2016-12-03 MED ORDER — MEGESTROL ACETATE 40 MG PO TABS
40.0000 mg | ORAL_TABLET | Freq: Every day | ORAL | Status: DC
  Administered 2016-12-03: 40 mg via ORAL
  Filled 2016-12-03: qty 1

## 2016-12-03 MED ORDER — MEGESTROL ACETATE 40 MG PO TABS: 40.0000 mg | ORAL_TABLET | Freq: Every day | ORAL | 1 refills | Status: DC

## 2016-12-03 MED ORDER — ENSURE ENLIVE PO LIQD: 237.0000 mL | Freq: Three times a day (TID) | ORAL | 12 refills | Status: DC

## 2016-12-03 MED ORDER — OMEPRAZOLE 20 MG PO CPDR: 20.0000 mg | DELAYED_RELEASE_CAPSULE | Freq: Two times a day (BID) | ORAL | 1 refills | Status: AC

## 2016-12-03 MED ORDER — ENSURE ENLIVE PO LIQD
237.0000 mL | Freq: Three times a day (TID) | ORAL | Status: DC
  Administered 2016-12-03 (×2): 237 mL via ORAL

## 2016-12-03 NOTE — Progress Notes (Signed)
*  PRELIMINARY RESULTS* Echocardiogram 2D Echocardiogram has been performed.  Sherrie Sport 12/03/2016, 3:27 PM

## 2016-12-03 NOTE — Evaluation (Signed)
Physical Therapy Evaluation Patient Details Name: Keith Chandler MRN: [id number] DOB: [birth year] Today's Date: 12/03/2016   History of Present Illness  Keith Chandler  is a 82 y.o. male with a known history of  Dm-2, HTN and history of peripheral neuropathy comes to the emergency room with feeling of epigastric discomfort due to "gas" and significant amount of flatus with weight loss 15 pounds loss of appetite and generalized weakness for past 1 month. [place] in the ED shows multiple lesions in liver. CT of abdomen shows Extensive tumor involvement in the chest, abdomen, and pelvis  Clinical Impression  82 yo male presents to hospital with recent weight loss and fatigue. Patient reports having peripheral neuropathy at baseline with decreased mobility noted over last several months. He denies any recent falls. He reports being independent in all self care ADLs prior to admittance. He was walking without assistive device and was driving. Patient lives in a single story home with 1 step to get to part of his home with a doorway. He lives with his wife who was diagnosed with Alzhemier's disease. She is still doing all the cooking and cleaning and he helps supervise his wife. His son lives nearby and can assist as needed. Patient was up in chair at start of evaluation. He is independent in transfers. He ambulated 175 feet without AD, mod I requiring increased time. He ambulates at 2.22 feet/sec which is less than community ambulator but does not put him at an increased risk for falls. He exhibits good strength with 5/5 in BLE. Patient does not exhibit any need for additional skilled PT intervention. He is currently exercising for 30-60 min each day with stretches and stationary bike. Recommended patient continue with home program and to contact MD if he notices a change in mobility; Patient agreeable.     Follow Up Recommendations No PT follow up    Equipment Recommendations  None recommended by PT     Recommendations for Other Services       Precautions / Restrictions Precautions Precautions: None Restrictions Weight Bearing Restrictions: No      Mobility  Bed Mobility               General bed mobility comments: pt up in chair at start of evaluation, bed mobility not observed;   Transfers Overall transfer level: Independent Equipment used: None             General transfer comment: transfers sit<>Stand from chair with good hand placement and safety awareness;   Ambulation/Gait Ambulation/Gait assistance: Modified independent (Device/Increase time) Ambulation Distance (Feet): 175 Feet Assistive device: None Gait Pattern/deviations: WFL(Within Functional Limits);Decreased dorsiflexion - right;Decreased dorsiflexion - left;Decreased step length - right;Decreased step length - left Gait velocity: 2.22 feet/sec   General Gait Details: ambulates with slightly slower gait speed (2.22 feet/sec) with shorter step length. Patient ambulates without assistive devices with good safety awareness and dynamic balance control.   Stairs            Wheelchair Mobility    Modified Rankin (Stroke Patients Only)       Balance Overall balance assessment: Needs assistance Sitting-balance support: No upper extremity supported;Feet supported Sitting balance-Leahy Scale: Normal     Standing balance support: No upper extremity supported Standing balance-Leahy Scale: Fair Standing balance comment: able to stand with feet together slight sway, immediate loss of balance with eyes closed;          [place] - Eyes Opened: 10 [place] - Eyes Closed: 0  Pertinent Vitals/Pain Pain Assessment: No/denies pain    Home Living Family/patient expects to be discharged to:: Private residence Living Arrangements: Spouse/significant other (spouse has Alzheimers; son is active in helping care for patient and his wife; ) Available Help at Discharge:  Family Type of Home: House Home Access: Stairs to enter Entrance Stairs-Rails: Right;Left;Can reach both Entrance Stairs-Number of Steps: 3 Home Layout: One level;Other (Comment) (has 1, 8 inch step to get to part of home. Has doorway to hold onto for balance; ) Home Equipment: None Additional Comments: son reports that they have access to assistive devices if he needed them but patient does not personally own any equipment;     Prior Function Level of Independence: Independent         Comments: did not use any assistive devices; was driving prior to admittance; independent in bathing/dressing; wife did cooking/cleaning;      Hand Dominance        Extremity/Trunk Assessment   Upper Extremity Assessment Upper Extremity Assessment: Overall WFL for tasks assessed    Lower Extremity Assessment Lower Extremity Assessment: LLE deficits/detail;RLE deficits/detail RLE Deficits / Details: has numbness/tingling in feet from neuropathy; BLE strength 5/5; ROM is WFL LLE Deficits / Details: has numbness/tingling in feet from neuropathy; BLE strength 5/5; ROM is [facility]    Cervical / Trunk Assessment Cervical / Trunk Assessment: Normal  Communication   Communication: No difficulties  Cognition Arousal/Alertness: Awake/alert Behavior During Therapy: WFL for tasks assessed/performed Overall Cognitive Status: Within Functional Limits for tasks assessed                                        General Comments      Exercises Other Exercises Other Exercises: Patient educated on importance of continuing to exercise for fatigue management. He reports that he stretches for 30 min every day and rides a stationary bike for 30 min every day; Recommend patient continue for endurance training;    Assessment/Plan    PT Assessment Patent does not need any further PT services  PT Problem List         PT Treatment Interventions      PT Goals (Current goals can be found in the  Care Plan section)  Acute Rehab PT Goals Patient Stated Goal: "I want to go home."  PT Goal Formulation: With patient Time For Goal Achievement: 12/03/16 Potential to Achieve Goals: Good    Frequency     Barriers to discharge        Co-evaluation               AM-PAC PT "6 Clicks" Daily Activity  Outcome Measure Difficulty turning over in bed (including adjusting bedclothes, sheets and blankets)?: None Difficulty moving from lying on back to sitting on the side of the bed? : None Difficulty sitting down on and standing up from a chair with arms (e.g., wheelchair, bedside commode, etc,.)?: None Help needed moving to and from a bed to chair (including a wheelchair)?: None Help needed walking in hospital room?: None Help needed climbing 3-5 steps with a railing? : A Little 6 Click Score: 23    End of Session Equipment Utilized During Treatment: Gait belt Activity Tolerance: Patient tolerated treatment well;No increased pain Patient left: in chair;with call bell/phone within reach;with family/visitor present Nurse Communication: Mobility status PT Visit Diagnosis: Muscle weakness (generalized) (M62.81)    Time: 8889-1694 PT Time  Calculation (min) (ACUTE ONLY): 13 min   Charges:   PT Evaluation $PT Eval Low Complexity: 1 Procedure     PT G Codes:   PT G-Codes **NOT FOR INPATIENT CLASS** Functional Assessment Tool Used: AM-PAC 6 Clicks Basic Mobility;Clinical judgement Functional Limitation: Mobility: Walking and moving around Mobility: Walking and Moving Around Current Status (F7588): At least 1 percent but less than 20 percent impaired, limited or restricted Mobility: Walking and Moving Around Goal Status [phone]): At least 1 percent but less than 20 percent impaired, limited or restricted Mobility: Walking and Moving Around Discharge Status [phone]): At least 1 percent but less than 20 percent impaired, limited or restricted      Lathyn Griggs PT,  DPT 12/03/2016, 11:57 AM

## 2016-12-03 NOTE — Progress Notes (Signed)
Initial Nutrition Assessment  DOCUMENTATION CODES:   Severe malnutrition in context of acute illness/injury  INTERVENTION:  Provide Ensure Enlive po TID, each supplement provides 350 kcal and 20 grams of protein.   Provide Magic cup BID with lunch and dinner, each supplement provides 290 kcal and 9 grams of protein.   Encouraged adequate intake of calories and protein to prevent further loss of body weight. Encouraged intake of small, frequent meals of foods patient will be able to tolerate. Discussed strategies to improve intake with poor appetite and nausea.  NUTRITION DIAGNOSIS:   Malnutrition (Severe) related to acute illness (poor appetite and decresed intake, tumor involvement in chest, abdomen, and pelvis concerning for malignancy) as evidenced by 6.9 percent weight loss over 1 to 1.5 months, mild depletion of body fat, moderate depletion of body fat.  GOAL:   Patient will meet greater than or equal to 90% of their needs  MONITOR:   PO intake, Supplement acceptance, Labs, Weight trends, I & O's  REASON FOR ASSESSMENT:   Malnutrition Screening Tool, Consult Assessment of nutrition requirement/status  ASSESSMENT:   82 year old male with PMHx of HTN, DM type 2, hx of neuropathy who presented with epigastric discomfort, weight loss, loss of appetite, and generalized weakness. Admitted for work-up of abnormal abdominal ultrasound showing metastatic liver lesions, and for chest/epigastric pressure.  -CT Abd/Pelvis with contrast on 7/15 shows extensive tumor involvement in chest, abdomen, pelvis. Small bilateral pulmonary nodules. Lytic metastatic lesions in lumbar spine and pelvis.  Spoke with patient and son at bedside. Patient reports he has had a poor appetite for the past 1.5 months. He has been eating small amounts of food. He reports he still attempts to eat 3 meals per day, but is usually only able to finish a few bites before feeling uncomfortable and nauseous. He can  tolerate softer foods such as potatoes, macaroni and cheese, oatmeal, and eggs. Also enjoys bacon. Able to tolerate some yogurt and pudding. Even the smell of his wife preparing food make shim nauseous. He is amenable to trying Ensure and Magic Cup to help meet calorie/protein needs.  UBW was 195-200 lbs. Limited weight history in chart. RD obtained bed scale weight of 181.6 lbs. Per his report, he has lost at least 13.4 lbs (6.9% body weight) over the past 1 to 1.5 months, which is significant for time frame.  Meal Completion: bites of breakfast this morning per chart  Medications reviewed and include: Novolog 0-9 units TID, pantoprazole.  Labs reviewed: CBG 135-182. On 7/15 Potassium 3.4, BUN 40, Creatinine 1.72, Lipase 86.  Nutrition-Focused physical exam completed. Findings are mild-moderate fat depletion (orbital region, buccal region, upper arm region), no muscle depletion, and no edema.   Diet Order:  Diet regular Room service appropriate? Yes; Fluid consistency: Thin  Skin:  Reviewed, no issues  Last BM:  12/03/2016  Height:   Ht Readings from Last 1 Encounters:  12/02/16 5\' 6"  (1.676 m)    Weight:   Wt Readings from Last 1 Encounters:  12/03/16 181 lb 9.6 oz (82.4 kg)    Ideal Body Weight:  64.5 kg  BMI:  Body mass index is 29.31 kg/m.  Estimated Nutritional Needs:   Kcal:  7096-2836 (MSJ x 1.3-1.5)  Protein:  100-115 grams (1.2-1.4 grams/kg)  Fluid:  2 L/day (25 ml/kg)  EDUCATION NEEDS:   Education needs addressed  Willey Blade, MS, RD, LDN Pager: [phone] After Hours Pager: [phone]

## 2016-12-03 NOTE — Progress Notes (Signed)
Pt is d/ced home.  He will f/u with Dr. Grayland Ormond at the [facility] for new diagnosis of cancer w/mets.  They will schedule a biopsy.  Pt had dietary consult today b/c he has had poor appetite for last few months and has lost 15 lbs.  Ensures have been added twice a day. Pt has severe neuropathy and takes gabapentin tid as needed.  Pt has good family support.  Son never left his side.  Reviewed d/c instructions and f/u appts.  Pt will transport home with his son.

## 2016-12-03 NOTE — Progress Notes (Signed)
Patient resting in bed with son at bedside. Denies pain. No apparent distress. Safety maintained.

## 2016-12-03 NOTE — Care Management Obs Status (Signed)
[facility] NOTIFICATION   Patient Details  Name: Keith Chandler MRN: [id number] Date of Birth: 1934/09/23   Medicare Observation Status Notification Given:  Yes    Katrina Stack, RN 12/03/2016, 9:43 AM

## 2016-12-03 NOTE — Progress Notes (Signed)
[facility] Cardiology [facility] Encounter Note  Patient: KAMIL MCHAFFIE / Admit Date: 12/02/2016 / Date of Encounter: 12/03/2016, 7:54 AM   Subjective: Patient still with some cough congestion and chest discomfort without evidence of myocardial infarction. Patient had CAT scan suggesting metastatic cancer and no evidence of primary cardiovascular symptoms  Review of Systems: Positive for: Chest pain cough congestion Negative for: Vision change, hearing change, syncope, dizziness, nausea, vomiting,diarrhea, bloody stool, stomach pain, positive for cough, congestion, negative for diaphoresis, urinary frequency, urinary pain,skin lesions, skin rashes Others previously listed  Objective: Telemetry: Normal sinus rhythm Physical Exam: Blood pressure (!) 121/57, pulse 85, temperature 98.4 F (36.9 C), temperature source Oral, resp. rate 20, height 5\' 6"  (1.676 m), weight 82.1 kg (181 lb), SpO2 90 %. Body mass index is 29.21 kg/m. General: Well developed, well nourished, in no acute distress. Head: Normocephalic, atraumatic, sclera non-icteric, no xanthomas, nares are without discharge. Neck: No apparent masses Lungs: Normal respirations with no wheezes, some rhonchi, no rales , no crackles   Heart: Regular rate and rhythm, normal S1 S2, no murmur, no rub, no gallop, PMI is normal size and placement, carotid upstroke normal without bruit, jugular venous pressure normal Abdomen: Soft, non-tender, non-distended with normoactive bowel sounds. No hepatosplenomegaly. Abdominal aorta is normal size without bruit Extremities: No edema, no clubbing, no cyanosis, no ulcers,  Peripheral: 2+ radial, 2+ femoral, 2+ dorsal pedal pulses Neuro: Alert and oriented. Moves all extremities spontaneously. Psych:  Responds to questions appropriately with a normal affect.   Intake/Output Summary (Last 24 hours) at 12/03/16 0754 Last data filed at 12/03/16 0336  Gross per 24 hour  Intake              576 ml   Output              350 ml  Net              226 ml    Inpatient Medications:  . aspirin  81 mg Oral Daily  . atorvastatin  10 mg Oral QPM  . enoxaparin (LOVENOX) injection  40 mg Subcutaneous Q24H  . insulin aspart  0-9 Units Subcutaneous TID WC  . pantoprazole  40 mg Oral Daily  . sodium chloride flush  3 mL Intravenous Q12H   Infusions:   Labs:  Recent Labs  12/02/16 1242  NA 139  K 3.4*  CL 105  CO2 26  GLUCOSE 189*  BUN 40*  CREATININE 1.72*  CALCIUM 9.0    Recent Labs  12/02/16 1242  AST 26  ALT 18  ALKPHOS 55  BILITOT 0.6  PROT 6.7  ALBUMIN 3.3*    Recent Labs  12/02/16 1242  WBC 12.1*  HGB 9.7*  HCT 28.3*  MCV 88.3  PLT 230    Recent Labs  12/02/16 1242 12/02/16 1842 12/03/16 0056 12/03/16 0443  TROPONINI <0.03 <0.03 <0.03 <0.03   Invalid input(s): POCBNP No results for input(s): HGBA1C in the last 72 hours.   Weights: Filed Weights   12/02/16 1200 12/02/16 1600  Weight: 83 kg (183 lb) 82.1 kg (181 lb)     Radiology/Studies:  Dg Chest 2 View  Result Date: 12/02/2016 CLINICAL DATA:  Fatigue, weakness, chest pressure and cough EXAM: CHEST  2 VIEW COMPARISON:  None. FINDINGS: Mild cardiomegaly with vascular congestion. Chronic nonspecific interstitial prominence without definite edema. No significant effusion or pneumothorax. No focal pneumonia, collapse or consolidation. Trachea is midline. Atherosclerosis of aorta. Degenerative changes of the spine. No  compression fracture. IMPRESSION: Cardiomegaly with vascular congestion Mild chronic interstitial changes suspected. No definite superimposed CHF or pneumonia. Electronically Signed   By: Jerilynn Mages.  Shick M.D.   On: 12/02/2016 12:51   Ct Chest W Contrast  Result Date: 12/02/2016 CLINICAL DATA:  Abnormal ultrasound showing liver masses, epigastric discomfort due to gas, lays, 15 pounds weight loss, loss of appetite, generalized weakness, type II diabetes mellitus, hypertension, former smoker  EXAM: CT CHEST, ABDOMEN, AND PELVIS WITH CONTRAST TECHNIQUE: Multidetector CT imaging of the chest, abdomen and pelvis was performed following the standard protocol during bolus administration of intravenous contrast. Sagittal and coronal MPR images reconstructed from axial data set. CONTRAST:  95mL ISOVUE-300 IOPAMIDOL (ISOVUE-300) INJECTION 61% IV. Dilute oral contrast. COMPARISON:  Abdominal ultrasound 12/02/2016 FINDINGS: CT CHEST FINDINGS Cardiovascular: Atherosclerotic calcifications aorta, coronary arteries, proximal great vessels. Aorta normal caliber. Small pericardial effusion. Mild LEFT ventricular dilatation. Displacement and compression of the IVC by mediastinal adenopathy the remains patent. Mediastinum/Nodes: Moderate-sized hiatal hernia question mild wall thickening of the midesophagus. Multiple enlarged mediastinal lymph nodes including a conglomerate nodal mass at the AP window 3.3 x 4.6 cm, pretracheal node 16 mm image 21, prevascular node 16 mm image 16, RIGHT superior mediastinal node 21 mm image 14, and necrotic subcarinal node 29 mm image 28. 17 mm RIGHT hilar node image 31. RIGHT superior hilar node 16 mm image 27. Adenopathy extends throughout the superior mediastinum on the RIGHT, displacing and compressing the IVC and extending to the base of the RIGHT cervical region. No axillary adenopathy. Unable to assess patency of the RIGHT subclavian vein. Lungs/Pleura: Small BILATERAL pleural effusions. Abnormal soft tissue/tumor surrounds the RIGHT and LEFT mainstem bronchi. Underlying emphysematous changes. Dependent atelectasis in the posterior lower lobes. No definite pulmonary mass. 4 mm RIGHT upper lobe nodule image 33. 3 mm RIGHT upper lobe nodule image 63. Calcified granuloma anterior RIGHT lung. Few additional scattered tiny BILATERAL pulmonary nodules. 6 mm LEFT upper lobe nodule image 25. No infiltrate or pneumothorax. Musculoskeletal: No acute osseous lesions. CT ABDOMEN PELVIS FINDINGS  Hepatobiliary: Subtle liver lesions question metastases including an 11 mm LEFT lobe lesion image 52, 9 mm RIGHT lobe lesion image 55, LEFT lobe lesion superiorly 8 mm image 50. Gallbladder unremarkable. Pancreas: Mild peripancreatic edema. Peripancreatic adenopathy. No discrete pancreatic mass or ductal dilatation. Spleen: Normal appearance Adrenals/Urinary Tract: BILATERAL adrenal nodules 17 x 12 mm LEFT and 9 x 6 mm RIGHT. LEFT renal cysts largest 4.5 x 3.4 cm image 72. Kidneys, ureters, and bladder otherwise normal appearance. Stomach/Bowel: Scattered stool throughout colon. Sigmoid diverticulosis without evidence of diverticulitis. No definite gastric or bowel mass. Normal appendix. Vascular/Lymphatic: Atherosclerotic calcifications aorta without aneurysm. Extensive adenopathy and mesenteric up to 15 mm short axis image 78. Peripancreatic, periportal and portal caval adenopathy up to 19 mm short axis image 62 and 16 mm image 56. Reproductive: Significant prostatic enlargement gland measuring 6.1 x 5.4 cm image 113. Other: Small amount of free fluid in pelvis. Significant edema in the mesenteric associated with mesenteric adenopathy. Multiple tumor nodules/implants in retroperitoneal fat in the flanks bilaterally. Possible peritoneal nodules anterior pelvis image 100 and LEFT pelvis image 94. BILATERAL inguinal hernias containing fat. No free air. Musculoskeletal: No acute osseous findings. Lytic metastatic lesions at L2, L3, L5, LEFT iliac. Probable metastatic lesion S2 segment of sacrum. IMPRESSION: Extensive tumor involvement in the chest, abdomen, and pelvis, with extensive adenopathy in the mediastinum and RIGHT hilum, mesenteric, periportal/peripancreatic. Small BILATERAL pulmonary nodules question pulmonary metastases. Lytic metastatic lesions  in lumbar spine and pelvis. Small hepatic and adrenal metastases as well as retroperitoneal tumor deposits bilaterally and question of peritoneal deposits in the  pelvis. No definite primary tumor is localized. Small BILATERAL pleural effusions. Electronically Signed   By: Lavonia Dana M.D.   On: 12/02/2016 18:27   Ct Abdomen Pelvis W Contrast  Result Date: 12/02/2016 CLINICAL DATA:  Abnormal ultrasound showing liver masses, epigastric discomfort due to gas, lays, 15 pounds weight loss, loss of appetite, generalized weakness, type II diabetes mellitus, hypertension, former smoker EXAM: CT CHEST, ABDOMEN, AND PELVIS WITH CONTRAST TECHNIQUE: Multidetector CT imaging of the chest, abdomen and pelvis was performed following the standard protocol during bolus administration of intravenous contrast. Sagittal and coronal MPR images reconstructed from axial data set. CONTRAST:  87mL ISOVUE-300 IOPAMIDOL (ISOVUE-300) INJECTION 61% IV. Dilute oral contrast. COMPARISON:  Abdominal ultrasound 12/02/2016 FINDINGS: CT CHEST FINDINGS Cardiovascular: Atherosclerotic calcifications aorta, coronary arteries, proximal great vessels. Aorta normal caliber. Small pericardial effusion. Mild LEFT ventricular dilatation. Displacement and compression of the IVC by mediastinal adenopathy the remains patent. Mediastinum/Nodes: Moderate-sized hiatal hernia question mild wall thickening of the midesophagus. Multiple enlarged mediastinal lymph nodes including a conglomerate nodal mass at the AP window 3.3 x 4.6 cm, pretracheal node 16 mm image 21, prevascular node 16 mm image 16, RIGHT superior mediastinal node 21 mm image 14, and necrotic subcarinal node 29 mm image 28. 17 mm RIGHT hilar node image 31. RIGHT superior hilar node 16 mm image 27. Adenopathy extends throughout the superior mediastinum on the RIGHT, displacing and compressing the IVC and extending to the base of the RIGHT cervical region. No axillary adenopathy. Unable to assess patency of the RIGHT subclavian vein. Lungs/Pleura: Small BILATERAL pleural effusions. Abnormal soft tissue/tumor surrounds the RIGHT and LEFT mainstem bronchi.  Underlying emphysematous changes. Dependent atelectasis in the posterior lower lobes. No definite pulmonary mass. 4 mm RIGHT upper lobe nodule image 33. 3 mm RIGHT upper lobe nodule image 63. Calcified granuloma anterior RIGHT lung. Few additional scattered tiny BILATERAL pulmonary nodules. 6 mm LEFT upper lobe nodule image 25. No infiltrate or pneumothorax. Musculoskeletal: No acute osseous lesions. CT ABDOMEN PELVIS FINDINGS Hepatobiliary: Subtle liver lesions question metastases including an 11 mm LEFT lobe lesion image 52, 9 mm RIGHT lobe lesion image 55, LEFT lobe lesion superiorly 8 mm image 50. Gallbladder unremarkable. Pancreas: Mild peripancreatic edema. Peripancreatic adenopathy. No discrete pancreatic mass or ductal dilatation. Spleen: Normal appearance Adrenals/Urinary Tract: BILATERAL adrenal nodules 17 x 12 mm LEFT and 9 x 6 mm RIGHT. LEFT renal cysts largest 4.5 x 3.4 cm image 72. Kidneys, ureters, and bladder otherwise normal appearance. Stomach/Bowel: Scattered stool throughout colon. Sigmoid diverticulosis without evidence of diverticulitis. No definite gastric or bowel mass. Normal appendix. Vascular/Lymphatic: Atherosclerotic calcifications aorta without aneurysm. Extensive adenopathy and mesenteric up to 15 mm short axis image 78. Peripancreatic, periportal and portal caval adenopathy up to 19 mm short axis image 62 and 16 mm image 56. Reproductive: Significant prostatic enlargement gland measuring 6.1 x 5.4 cm image 113. Other: Small amount of free fluid in pelvis. Significant edema in the mesenteric associated with mesenteric adenopathy. Multiple tumor nodules/implants in retroperitoneal fat in the flanks bilaterally. Possible peritoneal nodules anterior pelvis image 100 and LEFT pelvis image 94. BILATERAL inguinal hernias containing fat. No free air. Musculoskeletal: No acute osseous findings. Lytic metastatic lesions at L2, L3, L5, LEFT iliac. Probable metastatic lesion S2 segment of sacrum.  IMPRESSION: Extensive tumor involvement in the chest, abdomen, and pelvis, with extensive  adenopathy in the mediastinum and RIGHT hilum, mesenteric, periportal/peripancreatic. Small BILATERAL pulmonary nodules question pulmonary metastases. Lytic metastatic lesions in lumbar spine and pelvis. Small hepatic and adrenal metastases as well as retroperitoneal tumor deposits bilaterally and question of peritoneal deposits in the pelvis. No definite primary tumor is localized. Small BILATERAL pleural effusions. Electronically Signed   By: Lavonia Dana M.D.   On: 12/02/2016 18:27   US Abdomen Limited Ruq  Result Date: 12/02/2016 CLINICAL DATA:  Abdominal pain and belching for the past month. 15 pound weight loss over the past month. Ex-smoker. EXAM: ULTRASOUND ABDOMEN LIMITED RIGHT UPPER QUADRANT COMPARISON:  None. FINDINGS: Gallbladder: No gallstones or wall thickening visualized. No sonographic Murphy sign noted by sonographer. Common bile duct: Diameter: 3.7 mm Liver: Multiple rounded and oval hypoechoic masses within the liver. The largest mass measures 1.5 cm in maximum diameter. IMPRESSION: Multiple liver masses, most likely representing metastatic disease. Otherwise, normal examination. Electronically Signed   By: Claudie Revering M.D.   On: 12/02/2016 14:31     Assessment and Recommendation  82 y.o. male with known cardiovascular disease risk factors on appropriate medication management for further risk reduction cardiovascular event having chest pain shortness of breath weakness fatigue over the last several weeks without evidence of heart failure or myocardial infarction more consistent with possible metastatic cancer 1. Continue supportive care and further diagnosis and treatment of possible metastatic cancer causing above 2. Consider echocardiogram if necessary for LV systolic dysfunction with contribution to above and further medication management 3. No further intervention from the cardiac standpoint at  this time until further evaluation of extent of cancer treatment options 4. Call if further questions  Signed, Serafina Royals M.D. FACC

## 2016-12-03 NOTE — Care Management (Addendum)
Requested order for palliative for goals.  Possible malignancy lesions in liver.  Requested order for physical therapy.  patient placed in observation for weakness, weight loss, chest pressure.  Cardiology has consulted.  Patient lives with his wife who has alzheimer's.  Patient supervises his wife.  Patient is agreeable to home health if it is recommended.  Oncology consult is pending.  It is anticipated patient will discharge today.  No agency preference.  No agency preference home health

## 2016-12-03 NOTE — Discharge Summary (Signed)
[facility] at [place] NAME: Keith [facility]    MR#:  [id number]  DATE OF BIRTH:  1935/05/14  DATE OF ADMISSION:  12/02/2016 ADMITTING PHYSICIAN: Keith Mandes, MD  DATE OF DISCHARGE: 12/03/16  PRIMARY CARE PHYSICIAN: [facility], Keith    ADMISSION DIAGNOSIS:  Belching [R14.2] Chest discomfort [R07.89] SOB (shortness of breath) [R06.02] Liver metastases (HCC) [C78.7] Liver masses [R16.0] Abdominal pain [R10.9] Fatigue, unspecified type [R53.83]  DISCHARGE DIAGNOSIS:  Metastatic cancer with unknown primary--new diagnosis, [facility]up as [facility] with Dr. Grayland Ormond GERD Recent weight loss Severe malnutrition in context of acute illness SECONDARY DIAGNOSIS:   Past Medical History:  Diagnosis Date  . Diabetes mellitus without complication ([place])   . Hypertension   . Neuropathy     HOSPITAL COURSE:  Ke[facility]  is a 82 y.o. male with a known history of  Dm-2, HTN and history of peripheral neuropathy comes to the emergency room with feeling of epigastric discomfort due to "gas" and significant amount of flatus with weight loss 15 pounds loss of appetite and generalized weakness for past 1 month.  1. Generalized weakness, loss of appetite, weight loss 15 pounds along with epigastric discomfort and abnormal ultrasound abdomen with metastatic liver lesions worrisome for possible malignancy origin unknown -Aspirin 81 mg daily. -CT abdomen and pelvis and CT chest with contrast--patient has Extensive tumor involvement in the chest, abdomen, and pelvis, with extensive adenopathy in the mediastinum and RIGHT hilum, mesenteric,periportal/peripancreatic -Patient was seen by Dr. Grayland Ormond recommends outpatient follow-up at the [facility] and discussion regarding further management.-  2. Chest pressure with gaseous feeling in the epigastric area worrisome for GERD, rule out MI -Troponin 3Remain negative. -Aspirin  81 mg daily -Protonix 40 mg daily -When necessary Maalox  3. Type 2 diabetes diet-controlled -Sliding-scale insulin  4. Hypertension -Blood pressure stable hold BP meds -Blood pressure stable we'll hold off on any BP meds at discharge.  5. Hyperlipidemia -On Lipitor  6. DVT prophylaxis subcutaneous Lovenox  7. Severe protein calorie malnutrition with loss of appetite and weight loss -Trial of Megace -Appreciate dietitian.  Discussed with patient and son.  Discharge home with outpatient follow-up with Dr. Grayland Ormond  CONSULTS OBTAINED:  Treatment Team:  Keith Huger, MD  DRUG ALLERGIES:   Allergies  Allergen Reactions  . Sulfa Antibiotics     DISCHARGE MEDICATIONS:   Current Discharge Medication List    START taking these medications   Details  feeding supplement, ENSURE ENLIVE, (ENSURE ENLIVE) LIQD Take 237 mLs by mouth 3 (three) times daily between meals. Qty: 237 mL, Refills: 12    megestrol (MEGACE) 40 MG tablet Take 1 tablet (40 mg total) by mouth daily. Qty: 30 tablet, Refills: 1      CONTINUE these medications which have CHANGED   Details  omeprazole (PRILOSEC) 20 MG capsule Take 1 capsule (20 mg total) by mouth 2 (two) times daily before a meal. Qty: 60 capsule, Refills: 1      CONTINUE these medications which have NOT CHANGED   Details  aspirin EC 81 MG tablet Take 81 mg by mouth daily.    atorvastatin (LIPITOR) 10 MG tablet Take 10 mg by mouth daily.    gabapentin (NEURONTIN) 600 MG tablet Take 600 mg by mouth 3 (three) times daily as needed.      STOP taking these medications     triamterene-hydrochlorothiazide (DYAZIDE) 37.5-25 MG capsule  If you experience worsening of your admission symptoms, develop shortness of breath, life threatening emergency, suicidal or homicidal thoughts you must seek medical attention immediately by calling 911 or calling your MD immediately  if symptoms less severe.  You Must read complete  instructions/literature along with all the possible adverse reactions/side effects for all the Medicines you take and that have been prescribed to you. Take any new Medicines after you have completely understood and accept all the possible adverse reactions/side effects.   Please note  You were cared for by a hospitalist during your hospital stay. If you have any questions about your discharge medications or the care you received while you were in the hospital after you are discharged, you can call the unit and asked to speak with the hospitalist on call if the hospitalist that took care of you is not available. Once you are discharged, your primary care physician will handle any further medical issues. Please note that NO REFILLS for any discharge medications will be authorized once you are discharged, as it is imperative that you return to your primary care physician (or establish a relationship with a primary care physician if you do not have one) for your aftercare needs so that they can reassess your need for medications and monitor your lab values. Today   SUBJECTIVE   Able to ambulate in the room but feels weak. Poor appetite.  VITAL SIGNS:  Blood pressure (!) 114/51, pulse 93, temperature 98.1 F (36.7 C), temperature source Oral, resp. rate 18, height 5\' 6"  (1.676 m), weight 82.4 kg (181 lb 9.6 oz), SpO2 95 %.  I/O:   Intake/Output Summary (Last 24 hours) at 12/03/16 1409 Last data filed at 12/03/16 1013  Gross per 24 hour  Intake              576 ml  Output              350 ml  Net              226 ml    PHYSICAL EXAMINATION:  GENERAL:  82 y.o.-year-old patient lying in the bed with no acute distress.  EYES: Pupils equal, round, reactive to light and accommodation. No scleral icterus. Extraocular muscles intact.  HEENT: Head atraumatic, normocephalic. Oropharynx and nasopharynx clear.  NECK:  Supple, no jugular venous distention. No thyroid enlargement, no tenderness.  LUNGS:  Normal breath sounds bilaterally, no wheezing, rales,rhonchi or crepitation. No use of accessory muscles of respiration.  CARDIOVASCULAR: S1, S2 normal. No murmurs, rubs, or gallops.  ABDOMEN: Soft, non-tender, non-distended. Bowel sounds present. No organomegaly or mass.  EXTREMITIES: No pedal edema, cyanosis, or clubbing.  NEUROLOGIC: Cranial nerves II through XII are intact. Muscle strength 5/5 in all extremities. Sensation intact. Gait not checked.  PSYCHIATRIC: The patient is alert and oriented x 3.  SKIN: No obvious rash, lesion, or ulcer.   DATA REVIEW:   CBC   Recent Labs Lab 12/02/16 1242  WBC 12.1*  HGB 9.7*  HCT 28.3*  PLT 230    Chemistries   Recent Labs Lab 12/02/16 1242  NA 139  K 3.4*  CL 105  CO2 26  GLUCOSE 189*  BUN 40*  CREATININE 1.72*  CALCIUM 9.0  AST 26  ALT 18  ALKPHOS 55  BILITOT 0.6    Microbiology Results   No results found for this or any previous visit (from the past 240 hour(s)).  RADIOLOGY:  Dg Chest 2 View  Result Date: 12/02/2016 CLINICAL  DATA:  Fatigue, weakness, chest pressure and cough EXAM: CHEST  2 VIEW COMPARISON:  None. FINDINGS: Mild cardiomegaly with vascular congestion. Chronic nonspecific interstitial prominence without definite edema. No significant effusion or pneumothorax. No focal pneumonia, collapse or consolidation. Trachea is midline. Atherosclerosis of aorta. Degenerative changes of the spine. No compression fracture. IMPRESSION: Cardiomegaly with vascular congestion Mild chronic interstitial changes suspected. No definite superimposed CHF or pneumonia. Electronically Signed   By: Jerilynn Mages.  Shick M.D.   On: 12/02/2016 12:51   Ct Chest W Contrast  Result Date: 12/02/2016 CLINICAL DATA:  Abnormal ultrasound showing liver masses, epigastric discomfort due to gas, lays, 15 pounds weight loss, loss of appetite, generalized weakness, type II diabetes mellitus, hypertension, former smoker EXAM: CT CHEST, ABDOMEN, AND PELVIS  WITH CONTRAST TECHNIQUE: Multidetector CT imaging of the chest, abdomen and pelvis was performed following the standard protocol during bolus administration of intravenous contrast. Sagittal and coronal MPR images reconstructed from axial data set. CONTRAST:  71mL ISOVUE-300 IOPAMIDOL (ISOVUE-300) INJECTION 61% IV. Dilute oral contrast. COMPARISON:  Abdominal ultrasound 12/02/2016 FINDINGS: CT CHEST FINDINGS Cardiovascular: Atherosclerotic calcifications aorta, coronary arteries, proximal great vessels. Aorta normal caliber. Small pericardial effusion. Mild LEFT ventricular dilatation. Displacement and compression of the IVC by mediastinal adenopathy the remains patent. Mediastinum/Nodes: Moderate-sized hiatal hernia question mild wall thickening of the midesophagus. Multiple enlarged mediastinal lymph nodes including a conglomerate nodal mass at the AP window 3.3 x 4.6 cm, pretracheal node 16 mm image 21, prevascular node 16 mm image 16, RIGHT superior mediastinal node 21 mm image 14, and necrotic subcarinal node 29 mm image 28. 17 mm RIGHT hilar node image 31. RIGHT superior hilar node 16 mm image 27. Adenopathy extends throughout the superior mediastinum on the RIGHT, displacing and compressing the IVC and extending to the base of the RIGHT cervical region. No axillary adenopathy. Unable to assess patency of the RIGHT subclavian vein. Lungs/Pleura: Small BILATERAL pleural effusions. Abnormal soft tissue/tumor surrounds the RIGHT and LEFT mainstem bronchi. Underlying emphysematous changes. Dependent atelectasis in the posterior lower lobes. No definite pulmonary mass. 4 mm RIGHT upper lobe nodule image 33. 3 mm RIGHT upper lobe nodule image 63. Calcified granuloma anterior RIGHT lung. Few additional scattered tiny BILATERAL pulmonary nodules. 6 mm LEFT upper lobe nodule image 25. No infiltrate or pneumothorax. Musculoskeletal: No acute osseous lesions. CT ABDOMEN PELVIS FINDINGS Hepatobiliary: Subtle liver lesions  question metastases including an 11 mm LEFT lobe lesion image 52, 9 mm RIGHT lobe lesion image 55, LEFT lobe lesion superiorly 8 mm image 50. Gallbladder unremarkable. Pancreas: Mild peripancreatic edema. Peripancreatic adenopathy. No discrete pancreatic mass or ductal dilatation. Spleen: Normal appearance Adrenals/Urinary Tract: BILATERAL adrenal nodules 17 x 12 mm LEFT and 9 x 6 mm RIGHT. LEFT renal cysts largest 4.5 x 3.4 cm image 72. Kidneys, ureters, and bladder otherwise normal appearance. Stomach/Bowel: Scattered stool throughout colon. Sigmoid diverticulosis without evidence of diverticulitis. No definite gastric or bowel mass. Normal appendix. Vascular/Lymphatic: Atherosclerotic calcifications aorta without aneurysm. Extensive adenopathy and mesenteric up to 15 mm short axis image 78. Peripancreatic, periportal and portal caval adenopathy up to 19 mm short axis image 62 and 16 mm image 56. Reproductive: Significant prostatic enlargement gland measuring 6.1 x 5.4 cm image 113. Other: Small amount of free fluid in pelvis. Significant edema in the mesenteric associated with mesenteric adenopathy. Multiple tumor nodules/implants in retroperitoneal fat in the flanks bilaterally. Possible peritoneal nodules anterior pelvis image 100 and LEFT pelvis image 94. BILATERAL inguinal hernias containing fat. No free air.  Musculoskeletal: No acute osseous findings. Lytic metastatic lesions at L2, L3, L5, LEFT iliac. Probable metastatic lesion S2 segment of sacrum. IMPRESSION: Extensive tumor involvement in the chest, abdomen, and pelvis, with extensive adenopathy in the mediastinum and RIGHT hilum, mesenteric, periportal/peripancreatic. Small BILATERAL pulmonary nodules question pulmonary metastases. Lytic metastatic lesions in lumbar spine and pelvis. Small hepatic and adrenal metastases as well as retroperitoneal tumor deposits bilaterally and question of peritoneal deposits in the pelvis. No definite primary tumor is  localized. Small BILATERAL pleural effusions. Electronically Signed   By: Lavonia Dana M.D.   On: 12/02/2016 18:27   Ct Abdomen Pelvis W Contrast  Result Date: 12/02/2016 CLINICAL DATA:  Abnormal ultrasound showing liver masses, epigastric discomfort due to gas, lays, 15 pounds weight loss, loss of appetite, generalized weakness, type II diabetes mellitus, hypertension, former smoker EXAM: CT CHEST, ABDOMEN, AND PELVIS WITH CONTRAST TECHNIQUE: Multidetector CT imaging of the chest, abdomen and pelvis was performed following the standard protocol during bolus administration of intravenous contrast. Sagittal and coronal MPR images reconstructed from axial data set. CONTRAST:  29mL ISOVUE-300 IOPAMIDOL (ISOVUE-300) INJECTION 61% IV. Dilute oral contrast. COMPARISON:  Abdominal ultrasound 12/02/2016 FINDINGS: CT CHEST FINDINGS Cardiovascular: Atherosclerotic calcifications aorta, coronary arteries, proximal great vessels. Aorta normal caliber. Small pericardial effusion. Mild LEFT ventricular dilatation. Displacement and compression of the IVC by mediastinal adenopathy the remains patent. Mediastinum/Nodes: Moderate-sized hiatal hernia question mild wall thickening of the midesophagus. Multiple enlarged mediastinal lymph nodes including a conglomerate nodal mass at the AP window 3.3 x 4.6 cm, pretracheal node 16 mm image 21, prevascular node 16 mm image 16, RIGHT superior mediastinal node 21 mm image 14, and necrotic subcarinal node 29 mm image 28. 17 mm RIGHT hilar node image 31. RIGHT superior hilar node 16 mm image 27. Adenopathy extends throughout the superior mediastinum on the RIGHT, displacing and compressing the IVC and extending to the base of the RIGHT cervical region. No axillary adenopathy. Unable to assess patency of the RIGHT subclavian vein. Lungs/Pleura: Small BILATERAL pleural effusions. Abnormal soft tissue/tumor surrounds the RIGHT and LEFT mainstem bronchi. Underlying emphysematous changes.  Dependent atelectasis in the posterior lower lobes. No definite pulmonary mass. 4 mm RIGHT upper lobe nodule image 33. 3 mm RIGHT upper lobe nodule image 63. Calcified granuloma anterior RIGHT lung. Few additional scattered tiny BILATERAL pulmonary nodules. 6 mm LEFT upper lobe nodule image 25. No infiltrate or pneumothorax. Musculoskeletal: No acute osseous lesions. CT ABDOMEN PELVIS FINDINGS Hepatobiliary: Subtle liver lesions question metastases including an 11 mm LEFT lobe lesion image 52, 9 mm RIGHT lobe lesion image 55, LEFT lobe lesion superiorly 8 mm image 50. Gallbladder unremarkable. Pancreas: Mild peripancreatic edema. Peripancreatic adenopathy. No discrete pancreatic mass or ductal dilatation. Spleen: Normal appearance Adrenals/Urinary Tract: BILATERAL adrenal nodules 17 x 12 mm LEFT and 9 x 6 mm RIGHT. LEFT renal cysts largest 4.5 x 3.4 cm image 72. Kidneys, ureters, and bladder otherwise normal appearance. Stomach/Bowel: Scattered stool throughout colon. Sigmoid diverticulosis without evidence of diverticulitis. No definite gastric or bowel mass. Normal appendix. Vascular/Lymphatic: Atherosclerotic calcifications aorta without aneurysm. Extensive adenopathy and mesenteric up to 15 mm short axis image 78. Peripancreatic, periportal and portal caval adenopathy up to 19 mm short axis image 62 and 16 mm image 56. Reproductive: Significant prostatic enlargement gland measuring 6.1 x 5.4 cm image 113. Other: Small amount of free fluid in pelvis. Significant edema in the mesenteric associated with mesenteric adenopathy. Multiple tumor nodules/implants in retroperitoneal fat in the flanks bilaterally. Possible peritoneal  nodules anterior pelvis image 100 and LEFT pelvis image 94. BILATERAL inguinal hernias containing fat. No free air. Musculoskeletal: No acute osseous findings. Lytic metastatic lesions at L2, L3, L5, LEFT iliac. Probable metastatic lesion S2 segment of sacrum. IMPRESSION: Extensive tumor  involvement in the chest, abdomen, and pelvis, with extensive adenopathy in the mediastinum and RIGHT hilum, mesenteric, periportal/peripancreatic. Small BILATERAL pulmonary nodules question pulmonary metastases. Lytic metastatic lesions in lumbar spine and pelvis. Small hepatic and adrenal metastases as well as retroperitoneal tumor deposits bilaterally and question of peritoneal deposits in the pelvis. No definite primary tumor is localized. Small BILATERAL pleural effusions. Electronically Signed   By: Lavonia Dana M.D.   On: 12/02/2016 18:27   US Abdomen Limited Ruq  Result Date: 12/02/2016 CLINICAL DATA:  Abdominal pain and belching for the past month. 15 pound weight loss over the past month. Ex-smoker. EXAM: ULTRASOUND ABDOMEN LIMITED RIGHT UPPER QUADRANT COMPARISON:  None. FINDINGS: Gallbladder: No gallstones or wall thickening visualized. No sonographic Murphy sign noted by sonographer. Common bile duct: Diameter: 3.7 mm Liver: Multiple rounded and oval hypoechoic masses within the liver. The largest mass measures 1.5 cm in maximum diameter. IMPRESSION: Multiple liver masses, most likely representing metastatic disease. Otherwise, normal examination. Electronically Signed   By: Claudie Revering M.D.   On: 12/02/2016 14:31     Management plans discussed with the patient, family and they are in agreement.  CODE STATUS:     Code Status Orders        Start     Ordered   12/02/16 1654  Full code  Continuous     12/02/16 1653    Code Status History    Date Active Date Inactive Code Status Order ID Comments User Context   This patient has a current code status but no historical code status.    Advance Directive Documentation     Most Recent Value  Type of Advance Directive  Healthcare Power of Attorney, Living will  Pre-existing out of facility DNR order (yellow form or pink MOST form)  -  "MOST" Form in Place?  -      TOTAL TIME TAKING CARE OF THIS PATIENT: **40 minutes.     Imanuel Pruiett M.D on 12/03/2016 at 2:09 PM  Between 7am to 6pm - Pager - [phone] After 6pm go to [email or web address] - password [email or web address] [place] [facility]  Office  [phone]  CC: Primary care physician; [facility], [facility]

## 2016-12-03 NOTE — Consult Note (Signed)
[place]  [phone] [phone]  ID: Aundra Millet OB: 09-17-1934  MR#: [id number]  EXN#:[id number]  Patient Care Team: Center, [facility] as PCP - General (General Practice)  CHIEF COMPLAINT: Metastatic cancer, possible lung primary.  INTERVAL HISTORY: Patient is an 82 year old male who presented to the emergency room recently with worsening chest pressure, decreased appetite, and weight loss over the past month. Subsequent workup included CT scan which revealed widely metastatic disease with a possible lung primary. Patient currently feels well and is at his baseline. He has no neurologic complaint. He denies any recent fevers. His chest pressure has resolved and he denies any shortness of breath, cough, or hemoptysis. He has noted a more raspy voice over the past month as well. He denies any nausea, vomiting, constipation, or diarrhea. He has no melena or hematochezia. He has no urinary complaints. Patient offers no further specific complaints.  REVIEW OF SYSTEMS:   Review of Systems  Constitutional: Positive for weight loss. Negative for fever and malaise/fatigue.  Respiratory: Negative.  Negative for cough, hemoptysis and shortness of breath.   Cardiovascular: Negative.  Negative for chest pain and leg swelling.  Gastrointestinal: Negative.  Negative for abdominal pain, blood in stool and melena.  Genitourinary: Negative.   Musculoskeletal: Negative.   Skin: Negative.  Negative for rash.  Neurological: Negative.  Negative for sensory change and weakness.  Psychiatric/Behavioral: Negative.  The patient is not nervous/anxious.     As per HPI. Otherwise, a complete review of systems is negative.  PAST MEDICAL HISTORY: Past Medical History:  Diagnosis Date  . Diabetes mellitus without complication ([place])   . Hypertension   . Neuropathy     PAST SURGICAL HISTORY: History reviewed. No pertinent surgical history.  FAMILY  HISTORY: No family history on file.  ADVANCED DIRECTIVES (Y/N):  @ADVDIR @  HEALTH MAINTENANCE: Social History  Substance Use Topics  . Smoking status: Former Research scientist (life sciences)  . Smokeless tobacco: Never Used  . Alcohol use No     Colonoscopy:  PAP:  Bone density:  Lipid panel:  Allergies  Allergen Reactions  . Sulfa Antibiotics     Current Facility-Administered Medications  Medication Dose Route Frequency Provider Last Rate Last Dose  . acetaminophen (TYLENOL) tablet 650 mg  650 mg Oral Q6H PRN Fritzi Mandes, MD       Or  . acetaminophen (TYLENOL) suppository 650 mg  650 mg Rectal Q6H PRN Fritzi Mandes, MD      . aspirin chewable tablet 81 mg  81 mg Oral Daily Fritzi Mandes, MD   81 mg at 12/03/16 0909  . atorvastatin (LIPITOR) tablet 10 mg  10 mg Oral QPM Fritzi Mandes, MD      . enoxaparin (LOVENOX) injection 40 mg  40 mg Subcutaneous Q24H Fritzi Mandes, MD   40 mg at 12/02/16 2146  . feeding supplement (ENSURE ENLIVE) (ENSURE ENLIVE) liquid 237 mL  237 mL Oral TID BM Fritzi Mandes, MD   237 mL at 12/03/16 1445  . gabapentin (NEURONTIN) tablet 600 mg  600 mg Oral TID PRN Fritzi Mandes, MD   600 mg at 12/03/16 1441  . insulin aspart (novoLOG) injection 0-9 Units  0-9 Units Subcutaneous TID WC Fritzi Mandes, MD   2 Units at 12/03/16 1237  . megestrol (MEGACE) tablet 40 mg  40 mg Oral Daily Fritzi Mandes, MD   40 mg at 12/03/16 1236  . ondansetron (ZOFRAN) tablet 4 mg  4 mg Oral Q6H PRN Fritzi Mandes, MD  Or  . ondansetron (ZOFRAN) injection 4 mg  4 mg Intravenous Q6H PRN Fritzi Mandes, MD      . pantoprazole (PROTONIX) EC tablet 40 mg  40 mg Oral Daily Fritzi Mandes, MD   40 mg at 12/03/16 0909  . polyethylene glycol (MIRALAX / GLYCOLAX) packet 17 g  17 g Oral Daily PRN Fritzi Mandes, MD      . sodium chloride flush (NS) 0.9 % injection 3 mL  3 mL Intravenous Q12H Fritzi Mandes, MD   3 mL at 12/03/16 0911  . sodium chloride flush (NS) 0.9 % injection 3 mL  3 mL Intravenous PRN Fritzi Mandes, MD       Current  Outpatient Prescriptions  Medication Sig Dispense Refill  . aspirin EC 81 MG tablet Take 81 mg by mouth daily.    Marland Kitchen atorvastatin (LIPITOR) 10 MG tablet Take 10 mg by mouth daily.    Marland Kitchen gabapentin (NEURONTIN) 600 MG tablet Take 600 mg by mouth 3 (three) times daily as needed.    . feeding supplement, ENSURE ENLIVE, (ENSURE ENLIVE) LIQD Take 237 mLs by mouth 3 (three) times daily between meals. 237 mL 12  . [START ON 12/04/2016] megestrol (MEGACE) 40 MG tablet Take 1 tablet (40 mg total) by mouth daily. 30 tablet 1  . omeprazole (PRILOSEC) 20 MG capsule Take 1 capsule (20 mg total) by mouth 2 (two) times daily before a meal. 60 capsule 1    OBJECTIVE: Vitals:   12/03/16 0336 12/03/16 1259  BP: (!) 121/57 (!) 114/51  Pulse: 85 93  Resp: 20 18  Temp: 98.4 F (36.9 C) 98.1 F (36.7 C)     Body mass index is 29.31 kg/m.    ECOG FS:0 - Asymptomatic  General: Well-developed, well-nourished, no acute distress. Eyes: Pink conjunctiva, anicteric sclera. HEENT: Normocephalic, moist mucous membranes, clear oropharnyx. Lungs: Clear to auscultation bilaterally. Heart: Regular rate and rhythm. No rubs, murmurs, or gallops. Abdomen: Soft, nontender, nondistended. No organomegaly noted, normoactive bowel sounds. Musculoskeletal: No edema, cyanosis, or clubbing. Neuro: Alert, answering all questions appropriately. Cranial nerves grossly intact. Skin: No rashes or petechiae noted. Psych: Normal affect. Lymphatics: No cervical, calvicular, axillary or inguinal LAD.   LAB RESULTS:  Lab Results  Component Value Date   NA 139 12/02/2016   K 3.4 (L) 12/02/2016   CL 105 12/02/2016   CO2 26 12/02/2016   GLUCOSE 189 (H) 12/02/2016   BUN 40 (H) 12/02/2016   CREATININE 1.72 (H) 12/02/2016   CALCIUM 9.0 12/02/2016   PROT 6.7 12/02/2016   ALBUMIN 3.3 (L) 12/02/2016   AST 26 12/02/2016   ALT 18 12/02/2016   ALKPHOS 55 12/02/2016   BILITOT 0.6 12/02/2016   GFRNONAA 36 (L) 12/02/2016   GFRAA 41 (L)  12/02/2016    Lab Results  Component Value Date   WBC 12.1 (H) 12/02/2016   HGB 9.7 (L) 12/02/2016   HCT 28.3 (L) 12/02/2016   MCV 88.3 12/02/2016   PLT 230 12/02/2016     STUDIES: Dg Chest 2 View  Result Date: 12/02/2016 CLINICAL DATA:  Fatigue, weakness, chest pressure and cough EXAM: CHEST  2 VIEW COMPARISON:  None. FINDINGS: Mild cardiomegaly with vascular congestion. Chronic nonspecific interstitial prominence without definite edema. No significant effusion or pneumothorax. No focal pneumonia, collapse or consolidation. Trachea is midline. Atherosclerosis of aorta. Degenerative changes of the spine. No compression fracture. IMPRESSION: Cardiomegaly with vascular congestion Mild chronic interstitial changes suspected. No definite superimposed CHF or pneumonia. Electronically Signed  By: Eugenie Filler M.D.   On: 12/02/2016 12:51   Ct Chest W Contrast  Result Date: 12/02/2016 CLINICAL DATA:  Abnormal ultrasound showing liver masses, epigastric discomfort due to gas, lays, 15 pounds weight loss, loss of appetite, generalized weakness, type II diabetes mellitus, hypertension, former smoker EXAM: CT CHEST, ABDOMEN, AND PELVIS WITH CONTRAST TECHNIQUE: Multidetector CT imaging of the chest, abdomen and pelvis was performed following the standard protocol during bolus administration of intravenous contrast. Sagittal and coronal MPR images reconstructed from axial data set. CONTRAST:  50mL ISOVUE-300 IOPAMIDOL (ISOVUE-300) INJECTION 61% IV. Dilute oral contrast. COMPARISON:  Abdominal ultrasound 12/02/2016 FINDINGS: CT CHEST FINDINGS Cardiovascular: Atherosclerotic calcifications aorta, coronary arteries, proximal great vessels. Aorta normal caliber. Small pericardial effusion. Mild LEFT ventricular dilatation. Displacement and compression of the IVC by mediastinal adenopathy the remains patent. Mediastinum/Nodes: Moderate-sized hiatal hernia question mild wall thickening of the midesophagus. Multiple  enlarged mediastinal lymph nodes including a conglomerate nodal mass at the AP window 3.3 x 4.6 cm, pretracheal node 16 mm image 21, prevascular node 16 mm image 16, RIGHT superior mediastinal node 21 mm image 14, and necrotic subcarinal node 29 mm image 28. 17 mm RIGHT hilar node image 31. RIGHT superior hilar node 16 mm image 27. Adenopathy extends throughout the superior mediastinum on the RIGHT, displacing and compressing the IVC and extending to the base of the RIGHT cervical region. No axillary adenopathy. Unable to assess patency of the RIGHT subclavian vein. Lungs/Pleura: Small BILATERAL pleural effusions. Abnormal soft tissue/tumor surrounds the RIGHT and LEFT mainstem bronchi. Underlying emphysematous changes. Dependent atelectasis in the posterior lower lobes. No definite pulmonary mass. 4 mm RIGHT upper lobe nodule image 33. 3 mm RIGHT upper lobe nodule image 63. Calcified granuloma anterior RIGHT lung. Few additional scattered tiny BILATERAL pulmonary nodules. 6 mm LEFT upper lobe nodule image 25. No infiltrate or pneumothorax. Musculoskeletal: No acute osseous lesions. CT ABDOMEN PELVIS FINDINGS Hepatobiliary: Subtle liver lesions question metastases including an 11 mm LEFT lobe lesion image 52, 9 mm RIGHT lobe lesion image 55, LEFT lobe lesion superiorly 8 mm image 50. Gallbladder unremarkable. Pancreas: Mild peripancreatic edema. Peripancreatic adenopathy. No discrete pancreatic mass or ductal dilatation. Spleen: Normal appearance Adrenals/Urinary Tract: BILATERAL adrenal nodules 17 x 12 mm LEFT and 9 x 6 mm RIGHT. LEFT renal cysts largest 4.5 x 3.4 cm image 72. Kidneys, ureters, and bladder otherwise normal appearance. Stomach/Bowel: Scattered stool throughout colon. Sigmoid diverticulosis without evidence of diverticulitis. No definite gastric or bowel mass. Normal appendix. Vascular/Lymphatic: Atherosclerotic calcifications aorta without aneurysm. Extensive adenopathy and mesenteric up to 15 mm  short axis image 78. Peripancreatic, periportal and portal caval adenopathy up to 19 mm short axis image 62 and 16 mm image 56. Reproductive: Significant prostatic enlargement gland measuring 6.1 x 5.4 cm image 113. Other: Small amount of free fluid in pelvis. Significant edema in the mesenteric associated with mesenteric adenopathy. Multiple tumor nodules/implants in retroperitoneal fat in the flanks bilaterally. Possible peritoneal nodules anterior pelvis image 100 and LEFT pelvis image 94. BILATERAL inguinal hernias containing fat. No free air. Musculoskeletal: No acute osseous findings. Lytic metastatic lesions at L2, L3, L5, LEFT iliac. Probable metastatic lesion S2 segment of sacrum. IMPRESSION: Extensive tumor involvement in the chest, abdomen, and pelvis, with extensive adenopathy in the mediastinum and RIGHT hilum, mesenteric, periportal/peripancreatic. Small BILATERAL pulmonary nodules question pulmonary metastases. Lytic metastatic lesions in lumbar spine and pelvis. Small hepatic and adrenal metastases as well as retroperitoneal tumor deposits bilaterally and question of peritoneal deposits  in the pelvis. No definite primary tumor is localized. Small BILATERAL pleural effusions. Electronically Signed   By: Lavonia Dana M.D.   On: 12/02/2016 18:27   Ct Abdomen Pelvis W Contrast  Result Date: 12/02/2016 CLINICAL DATA:  Abnormal ultrasound showing liver masses, epigastric discomfort due to gas, lays, 15 pounds weight loss, loss of appetite, generalized weakness, type II diabetes mellitus, hypertension, former smoker EXAM: CT CHEST, ABDOMEN, AND PELVIS WITH CONTRAST TECHNIQUE: Multidetector CT imaging of the chest, abdomen and pelvis was performed following the standard protocol during bolus administration of intravenous contrast. Sagittal and coronal MPR images reconstructed from axial data set. CONTRAST:  62mL ISOVUE-300 IOPAMIDOL (ISOVUE-300) INJECTION 61% IV. Dilute oral contrast. COMPARISON:   Abdominal ultrasound 12/02/2016 FINDINGS: CT CHEST FINDINGS Cardiovascular: Atherosclerotic calcifications aorta, coronary arteries, proximal great vessels. Aorta normal caliber. Small pericardial effusion. Mild LEFT ventricular dilatation. Displacement and compression of the IVC by mediastinal adenopathy the remains patent. Mediastinum/Nodes: Moderate-sized hiatal hernia question mild wall thickening of the midesophagus. Multiple enlarged mediastinal lymph nodes including a conglomerate nodal mass at the AP window 3.3 x 4.6 cm, pretracheal node 16 mm image 21, prevascular node 16 mm image 16, RIGHT superior mediastinal node 21 mm image 14, and necrotic subcarinal node 29 mm image 28. 17 mm RIGHT hilar node image 31. RIGHT superior hilar node 16 mm image 27. Adenopathy extends throughout the superior mediastinum on the RIGHT, displacing and compressing the IVC and extending to the base of the RIGHT cervical region. No axillary adenopathy. Unable to assess patency of the RIGHT subclavian vein. Lungs/Pleura: Small BILATERAL pleural effusions. Abnormal soft tissue/tumor surrounds the RIGHT and LEFT mainstem bronchi. Underlying emphysematous changes. Dependent atelectasis in the posterior lower lobes. No definite pulmonary mass. 4 mm RIGHT upper lobe nodule image 33. 3 mm RIGHT upper lobe nodule image 63. Calcified granuloma anterior RIGHT lung. Few additional scattered tiny BILATERAL pulmonary nodules. 6 mm LEFT upper lobe nodule image 25. No infiltrate or pneumothorax. Musculoskeletal: No acute osseous lesions. CT ABDOMEN PELVIS FINDINGS Hepatobiliary: Subtle liver lesions question metastases including an 11 mm LEFT lobe lesion image 52, 9 mm RIGHT lobe lesion image 55, LEFT lobe lesion superiorly 8 mm image 50. Gallbladder unremarkable. Pancreas: Mild peripancreatic edema. Peripancreatic adenopathy. No discrete pancreatic mass or ductal dilatation. Spleen: Normal appearance Adrenals/Urinary Tract: BILATERAL adrenal  nodules 17 x 12 mm LEFT and 9 x 6 mm RIGHT. LEFT renal cysts largest 4.5 x 3.4 cm image 72. Kidneys, ureters, and bladder otherwise normal appearance. Stomach/Bowel: Scattered stool throughout colon. Sigmoid diverticulosis without evidence of diverticulitis. No definite gastric or bowel mass. Normal appendix. Vascular/Lymphatic: Atherosclerotic calcifications aorta without aneurysm. Extensive adenopathy and mesenteric up to 15 mm short axis image 78. Peripancreatic, periportal and portal caval adenopathy up to 19 mm short axis image 62 and 16 mm image 56. Reproductive: Significant prostatic enlargement gland measuring 6.1 x 5.4 cm image 113. Other: Small amount of free fluid in pelvis. Significant edema in the mesenteric associated with mesenteric adenopathy. Multiple tumor nodules/implants in retroperitoneal fat in the flanks bilaterally. Possible peritoneal nodules anterior pelvis image 100 and LEFT pelvis image 94. BILATERAL inguinal hernias containing fat. No free air. Musculoskeletal: No acute osseous findings. Lytic metastatic lesions at L2, L3, L5, LEFT iliac. Probable metastatic lesion S2 segment of sacrum. IMPRESSION: Extensive tumor involvement in the chest, abdomen, and pelvis, with extensive adenopathy in the mediastinum and RIGHT hilum, mesenteric, periportal/peripancreatic. Small BILATERAL pulmonary nodules question pulmonary metastases. Lytic metastatic lesions in lumbar spine and  pelvis. Small hepatic and adrenal metastases as well as retroperitoneal tumor deposits bilaterally and question of peritoneal deposits in the pelvis. No definite primary tumor is localized. Small BILATERAL pleural effusions. Electronically Signed   By: Lavonia Dana M.D.   On: 12/02/2016 18:27   US Abdomen Limited Ruq  Result Date: 12/02/2016 CLINICAL DATA:  Abdominal pain and belching for the past month. 15 pound weight loss over the past month. Ex-smoker. EXAM: ULTRASOUND ABDOMEN LIMITED RIGHT UPPER QUADRANT COMPARISON:   None. FINDINGS: Gallbladder: No gallstones or wall thickening visualized. No sonographic Murphy sign noted by sonographer. Common bile duct: Diameter: 3.7 mm Liver: Multiple rounded and oval hypoechoic masses within the liver. The largest mass measures 1.5 cm in maximum diameter. IMPRESSION: Multiple liver masses, most likely representing metastatic disease. Otherwise, normal examination. Electronically Signed   By: Claudie Revering M.D.   On: 12/02/2016 14:31    ASSESSMENT: Metastatic cancer, possible lung primary.  PLAN:    1. Metastatic cancer, possible lung primary: CT scan results reviewed independently and reported as above. Patient will likely benefit from bronchoscopy/EBUS with biopsy to confirm a diagnosis. He will also need a PET scan and MRI to complete the staging workup. This can be accomplished as an outpatient. Will arrange for biopsy and imaging after discharge. Patient will then follow-up in the [place] in 1-2 weeks to discuss the results and treatment planning.  Appreciate consult, call with questions.   Lloyd Huger, MD   12/03/2016 5:59 PM

## 2016-12-04 ENCOUNTER — Inpatient Hospital Stay: Payer: Medicare Other | Admitting: Oncology

## 2016-12-04 ENCOUNTER — Other Ambulatory Visit: Payer: Self-pay

## 2016-12-04 ENCOUNTER — Other Ambulatory Visit: Payer: Self-pay | Admitting: *Deleted

## 2016-12-04 DIAGNOSIS — C3490 Malignant neoplasm of unspecified part of unspecified bronchus or lung: Secondary | ICD-10-CM

## 2016-12-04 DIAGNOSIS — C799 Secondary malignant neoplasm of unspecified site: Secondary | ICD-10-CM

## 2016-12-04 LAB — ECHOCARDIOGRAM COMPLETE
HEIGHTINCHES: 66 in
Weight: 2905.6 oz

## 2016-12-07 ENCOUNTER — Encounter: Payer: Self-pay | Admitting: *Deleted

## 2016-12-07 ENCOUNTER — Encounter: Payer: Self-pay | Admitting: Pulmonary Disease

## 2016-12-07 ENCOUNTER — Ambulatory Visit (INDEPENDENT_AMBULATORY_CARE_PROVIDER_SITE_OTHER): Payer: Medicare Other | Admitting: Pulmonary Disease

## 2016-12-07 ENCOUNTER — Other Ambulatory Visit: Payer: Self-pay | Admitting: Radiology

## 2016-12-07 VITALS — BP 112/56 | HR 94 | Resp 16 | Ht 66.0 in | Wt 180.0 lb

## 2016-12-07 DIAGNOSIS — R590 Localized enlarged lymph nodes: Secondary | ICD-10-CM

## 2016-12-07 DIAGNOSIS — R222 Localized swelling, mass and lump, trunk: Secondary | ICD-10-CM

## 2016-12-07 DIAGNOSIS — R490 Dysphonia: Secondary | ICD-10-CM | POA: Diagnosis not present

## 2016-12-07 NOTE — Progress Notes (Signed)
  Oncology Nurse Navigator Documentation  Navigator Location: [facility] (12/07/16 1600)   )Navigator Encounter Type: Telephone (12/07/16 1600) Telephone: [place] Call (12/07/16 1600)                           Interventions: Coordination of Care (12/07/16 1600)   Coordination of Care: Appts (12/07/16 1600)       phone call made to patient. Spoke with pt's son and gave appt to follow up with Dr. Grayland Ormond on Thursday 7/26 at 3:15pm at the [facility]. Son verbalized understanding.           Time Spent with Patient: 15 (12/07/16 1600)

## 2016-12-07 NOTE — Patient Instructions (Addendum)
We will arrange for biopsy of the supraclavicular mass by interventional radiology to be performed early next week  PET scan and MRI of brain have been scheduled  I have spoken with Dr. Grayland Ormond, he will see you after the PET scan is performed with the intention of initiating therapy as soon as possible  Follow-up with me will be arranged as needed

## 2016-12-09 NOTE — Progress Notes (Signed)
PULMONARY CONSULT NOTE  Requesting MD/Service: Grayland Ormond Date of initial consultation: 12/07/16 Reason for consultation: Extensive hilar and mediastinal adenopathy  PT PROFILE: 82 y.o. male former smoker who was hospitalized 07/15-07/16/18 with chest discomfort, dyspnea, fatigue, abdominal bloating, abdominal pain, weight loss, anorexia. CT chest, abdomen, pelvis was markedly abnormal as below. He is referred for consideration of bronchoscopy to biopsy the mediastinal and hilar adenopathy.  DATA: CT chest, abdomen, pelvis 12/02/16: Extensive tumor involvement in the chest, abdomen, and pelvis, with extensive adenopathy in the mediastinum and R hilum, mesenteric, periportal/peripancreatic. Small B pulmonary nodules question pulmonary metastases. Lytic metastatic lesions in lumbar spine and pelvis. Small hepatic and adrenal metastases as well as retroperitoneal tumor deposits bilaterally and question of peritoneal deposits in the pelvis. No definite primary tumor is localized. Small B pleural effusions.  HPI:  As above. During his brief hospitalization, he was seen by Dr. Grayland Ormond and this referral was arranged. He comes today with 2 family members who emphasize how rapidly he is declining. He is now unable to eat at all due to severe anorexia. He is profoundly weak and has difficulty ambulating. He also notes a scalp lesion posterior to his left ear which is increasing in size and painful. He reports diffuse arthralgias and myalgias. He has also developed hoarseness in the past couple of weeks.  Past Medical History:  Diagnosis Date  . Diabetes mellitus without complication ([place])   . Hypertension   . Neuropathy     History reviewed. No pertinent surgical history.  MEDICATIONS: I have reviewed all medications and confirmed regimen as documented  Social History   Social History  . Marital status: Married    Spouse name: N/A  . Number of children: N/A  . Years of education: N/A    Occupational History  . Not on file.   Social History Main Topics  . Smoking status: Former Smoker    Packs/day: 2.00    Years: 45.00    Types: Cigarettes  . Smokeless tobacco: Former Systems developer    Quit date: 05/21/1992  . Alcohol use No  . Drug use: No  . Sexual activity: Not on file   Other Topics Concern  . Not on file   Social History Narrative  . No narrative on file    History reviewed. No pertinent family history.  ROS: No otalgia, hearing loss, visual changes, nasal and sinus symptoms, mouth and throat problems No neck pain or adenopathy No abdominal pain, N/V/D, diarrhea, change in bowel pattern No dysuria, change in urinary pattern   Vitals:   12/07/16 1258  BP: (!) 112/56  Pulse: 94  Resp: 16  SpO2: 90%  Weight: 180 lb (81.6 kg)  Height: 5\' 6"  (1.676 m)     EXAM:  Gen: In wheelchair, fatigued appearing, no respiratory distress, hoarse voice quality HEENT: NCAT, sclera white, oropharynx normal, firm papular scabbed lesion posterior to left ear Neck: Bulky right lobulated supraclavicular mass Lungs: breath sounds normal, percussion normal Cardiovascular: RRR, no murmurs noted Abdomen: Soft, nontender, normal BS Ext: without clubbing, cyanosis, edema Neuro: CNs grossly intact, motor and sensory intact Skin: Limited exam, no lesions noted  DATA:   BMP Latest Ref Rng & Units 12/02/2016  Glucose 65 - 99 mg/dL 189(H)  BUN 6 - 20 mg/dL 40(H)  Creatinine 0.61 - 1.24 mg/dL 1.72(H)  Sodium 135 - 145 mmol/L 139  Potassium 3.5 - 5.1 mmol/L 3.4(L)  Chloride 101 - 111 mmol/L 105  CO2 22 - 32 mmol/L 26  Calcium  8.9 - 10.3 mg/dL 9.0    CBC Latest Ref Rng & Units 12/02/2016  WBC 3.8 - 10.6 K/uL 12.1(H)  Hemoglobin 13.0 - 18.0 g/dL 9.7(L)  Hematocrit 40.0 - 52.0 % 28.3(L)  Platelets 150 - 440 K/uL 230    CXR:    IMPRESSIONPLAN:     ICD-10-CM   1. Supraclavicular mass R22.2   2. Extensive mediastinal adenopathy R59.0   3. Hoarseness R49.0    Per his  family, the right supraclavicular fullness (which is obvious on visual inspection) has not been previously noted. D hoarseness almost certainly represents recurrent laryngeal nerve involvement as he has extensive left hilar adenopathy noted on CT chest. This appears to be a rapidly progressive malignancy with extensive lymph node involvement. Therefore, I believe it is imperative that we obtain a tissue diagnosis as soon as possible so that therapy can be initiated. Diagnostic options include EBUS versus ultrasound-guided biopsy of the right supraclavicular mass. The latter can be scheduled at a sooner date and therefore we will proceed with this. I have discussed with Dr. Grayland Ormond.  PLAN:  1) ultrasound guided biopsy of right supraclavicular mass 2) PET scan and MRI brain ordered for 07/25 3) follow-up is already scheduled with Dr. Grayland Ormond 07/26 4) I have not scheduled follow-up in pulmonary clinic but we will be available as needed. If for some reason, the biopsy of the supraclavicular mass is nondiagnostic, we will proceed with bronchoscopic evaluation urgently.  All of the above was discussed in detail with the patient and his family members   Merton Border, MD PCCM service [place] [phone] Pager [phone] 12/09/2016 3:32 PM

## 2016-12-10 ENCOUNTER — Ambulatory Visit
Admission: RE | Admit: 2016-12-10 | Discharge: 2016-12-10 | Disposition: A | Discharge status: Home / Self Care | Payer: Medicare Other | Source: Ambulatory Visit | Attending: Pulmonary Disease | Admitting: Pulmonary Disease

## 2016-12-10 DIAGNOSIS — Z87891 Personal history of nicotine dependence: Secondary | ICD-10-CM | POA: Insufficient documentation

## 2016-12-10 DIAGNOSIS — R222 Localized swelling, mass and lump, trunk: Secondary | ICD-10-CM

## 2016-12-10 DIAGNOSIS — E119 Type 2 diabetes mellitus without complications: Secondary | ICD-10-CM | POA: Diagnosis not present

## 2016-12-10 DIAGNOSIS — J90 Pleural effusion, not elsewhere classified: Secondary | ICD-10-CM | POA: Diagnosis not present

## 2016-12-10 DIAGNOSIS — Z79899 Other long term (current) drug therapy: Secondary | ICD-10-CM | POA: Diagnosis not present

## 2016-12-10 DIAGNOSIS — I119 Hypertensive heart disease without heart failure: Secondary | ICD-10-CM | POA: Insufficient documentation

## 2016-12-10 DIAGNOSIS — C7951 Secondary malignant neoplasm of bone: Secondary | ICD-10-CM | POA: Insufficient documentation

## 2016-12-10 DIAGNOSIS — R590 Localized enlarged lymph nodes: Secondary | ICD-10-CM | POA: Diagnosis not present

## 2016-12-10 DIAGNOSIS — Z7982 Long term (current) use of aspirin: Secondary | ICD-10-CM | POA: Insufficient documentation

## 2016-12-10 DIAGNOSIS — K769 Liver disease, unspecified: Secondary | ICD-10-CM | POA: Diagnosis not present

## 2016-12-10 LAB — CBC
HEMATOCRIT: 25.6 % — AB (ref 40.0–52.0)
Hemoglobin: 8.7 g/dL — ABNORMAL LOW (ref 13.0–18.0)
MCH: 29.7 pg (ref 26.0–34.0)
MCHC: 33.9 g/dL (ref 32.0–36.0)
MCV: 87.6 fL (ref 80.0–100.0)
PLATELETS: 251 10*3/uL (ref 150–440)
RBC: 2.92 MIL/uL — AB (ref 4.40–5.90)
RDW: 13.6 % (ref 11.5–14.5)
WBC: 10.5 10*3/uL (ref 3.8–10.6)

## 2016-12-10 LAB — APTT: aPTT: 32 seconds (ref 24–36)

## 2016-12-10 LAB — PROTIME-INR
INR: 1.22
Prothrombin Time: 15.5 seconds — ABNORMAL HIGH (ref 11.4–15.2)

## 2016-12-10 LAB — GLUCOSE, CAPILLARY: Glucose-Capillary: 130 mg/dL — ABNORMAL HIGH (ref 65–99)

## 2016-12-10 MED ORDER — MIDAZOLAM HCL 5 MG/5ML IJ SOLN
INTRAMUSCULAR | Status: AC
  Filled 2016-12-10: qty 5

## 2016-12-10 MED ORDER — MIDAZOLAM HCL 5 MG/5ML IJ SOLN
INTRAMUSCULAR | Status: AC | PRN
  Administered 2016-12-10: 1 mg via INTRAVENOUS

## 2016-12-10 MED ORDER — FENTANYL CITRATE (PF) 100 MCG/2ML IJ SOLN
INTRAMUSCULAR | Status: AC | PRN
  Administered 2016-12-10: 50 ug via INTRAVENOUS

## 2016-12-10 MED ORDER — SODIUM CHLORIDE 0.9 % IV SOLN: INTRAVENOUS | Status: DC

## 2016-12-10 MED ORDER — FENTANYL CITRATE (PF) 100 MCG/2ML IJ SOLN
INTRAMUSCULAR | Status: AC
  Filled 2016-12-10: qty 2

## 2016-12-10 NOTE — Procedures (Signed)
Interventional Radiology Procedure Note  Procedure: US guided core biopsy of right supraclavicular lymph node mass   Complications: None  Estimated Blood Loss: < 10 mL  18 G core biopsy x 5 of enlarged right supraclavicular lymph node.  Venetia Night. Kathlene Cote, M.D Pager:  [phone]

## 2016-12-10 NOTE — H&P (Signed)
Chief Complaint: Patient was seen in consultation today for lymph node biopsy at the request of Simonds,David B  Referring Physician(s): Simonds,David B  Patient Status: [facility] - Out-pt  History of Present Illness: Keith Chandler is a 82 y.o. male presenting with diffuse lymphadenopathy, liver lesions, lytic bone metastases and small pulmonary nodules.  Most accessible tissue for biopsy is a right supraclavicular lymph node mass.  Complains today of lack of appetite and dark, "black" stools.  For PET scan and MRI of brain this week.  Seeing Dr. Alyssa Grove at [facility] this Friday.  Past Medical History:  Diagnosis Date  . Diabetes mellitus without complication ([place])   . Hypertension   . Neuropathy     History reviewed. No pertinent surgical history.  Allergies: Sulfa antibiotics  Medications: Prior to Admission medications   Medication Sig Start Date End Date Taking? Authorizing Provider  aspirin EC 81 MG tablet Take 81 mg by mouth daily.   Yes [provider]  atorvastatin (LIPITOR) 10 MG tablet Take 10 mg by mouth daily.   Yes [provider]  gabapentin (NEURONTIN) 600 MG tablet Take 600 mg by mouth 3 (three) times daily as needed.   Yes [provider]  megestrol (MEGACE) 40 MG tablet Take 1 tablet (40 mg total) by mouth daily. 12/04/16  Yes Fritzi Mandes, MD  omeprazole (PRILOSEC) 20 MG capsule Take 1 capsule (20 mg total) by mouth 2 (two) times daily before a meal. 12/03/16  Yes Fritzi Mandes, MD     History reviewed. No pertinent family history.  Social History   Social History  . Marital status: Married    Spouse name: N/A  . Number of children: N/A  . Years of education: N/A   Social History Main Topics  . Smoking status: Former Smoker    Packs/day: 2.00    Years: 45.00    Types: Cigarettes  . Smokeless tobacco: Former Systems developer    Quit date: 05/21/1992  . Alcohol use No  . Drug use: No  . Sexual activity: Not Asked   Other  Topics Concern  . None   Social History Narrative  . None    ECOG Status: 1 - Symptomatic but completely ambulatory  Review of Systems: A 12 point ROS discussed and pertinent positives are indicated in the HPI above.  All other systems are negative.  Review of Systems  Constitutional: Positive for appetite change. Negative for chills and fever.  HENT: Negative.   Respiratory: Negative.   Cardiovascular: Negative.   Gastrointestinal: Negative for abdominal distention, abdominal pain, anal bleeding, diarrhea, nausea and vomiting.       Dark stools  Genitourinary: Negative.   Musculoskeletal: Negative.   Neurological: Negative.     Vital Signs: BP 124/60 (BP Location: Left Arm)   Pulse 92   Temp 99.3 F (37.4 C) (Oral)   Resp 17   Ht 5\' 6"  (1.676 m)   Wt 180 lb (81.6 kg)   SpO2 92%   BMI 29.05 kg/m   Physical Exam  Constitutional: He is oriented to person, place, and time. No distress.  HENT:  Head: Normocephalic and atraumatic.  Neck: Neck supple. No JVD present.  Palpable right supraclavicular lymphadenopathy  Cardiovascular: Normal rate and regular rhythm.  Exam reveals no gallop and no friction rub.   No murmur heard. Pulmonary/Chest: Effort normal. No stridor. No respiratory distress. He has wheezes.  Some expiratory wheezing bilaterally  Abdominal: Soft. He exhibits no distension and  no mass. There is no tenderness. There is no rebound and no guarding.  Musculoskeletal: He exhibits no edema.  Neurological: He is alert and oriented to person, place, and time.  Skin: He is not diaphoretic.  Vitals reviewed.   Mallampati Score:  MD Evaluation Airway: WNL Heart: WNL Abdomen: WNL Chest/ Lungs: WNL ASA  Classification: 3 Mallampati/Airway Score: One  Imaging: Dg Chest 2 View  Result Date: 12/02/2016 CLINICAL DATA:  Fatigue, weakness, chest pressure and cough EXAM: CHEST  2 VIEW COMPARISON:  None. FINDINGS: Mild cardiomegaly with vascular congestion.  Chronic nonspecific interstitial prominence without definite edema. No significant effusion or pneumothorax. No focal pneumonia, collapse or consolidation. Trachea is midline. Atherosclerosis of aorta. Degenerative changes of the spine. No compression fracture. IMPRESSION: Cardiomegaly with vascular congestion Mild chronic interstitial changes suspected. No definite superimposed CHF or pneumonia. Electronically Signed   By: Jerilynn Mages.  Shick M.D.   On: 12/02/2016 12:51   Ct Chest W Contrast  Result Date: 12/02/2016 CLINICAL DATA:  Abnormal ultrasound showing liver masses, epigastric discomfort due to gas, lays, 15 pounds weight loss, loss of appetite, generalized weakness, type II diabetes mellitus, hypertension, former smoker EXAM: CT CHEST, ABDOMEN, AND PELVIS WITH CONTRAST TECHNIQUE: Multidetector CT imaging of the chest, abdomen and pelvis was performed following the standard protocol during bolus administration of intravenous contrast. Sagittal and coronal MPR images reconstructed from axial data set. CONTRAST:  64mL ISOVUE-300 IOPAMIDOL (ISOVUE-300) INJECTION 61% IV. Dilute oral contrast. COMPARISON:  Abdominal ultrasound 12/02/2016 FINDINGS: CT CHEST FINDINGS Cardiovascular: Atherosclerotic calcifications aorta, coronary arteries, proximal great vessels. Aorta normal caliber. Small pericardial effusion. Mild LEFT ventricular dilatation. Displacement and compression of the IVC by mediastinal adenopathy the remains patent. Mediastinum/Nodes: Moderate-sized hiatal hernia question mild wall thickening of the midesophagus. Multiple enlarged mediastinal lymph nodes including a conglomerate nodal mass at the AP window 3.3 x 4.6 cm, pretracheal node 16 mm image 21, prevascular node 16 mm image 16, RIGHT superior mediastinal node 21 mm image 14, and necrotic subcarinal node 29 mm image 28. 17 mm RIGHT hilar node image 31. RIGHT superior hilar node 16 mm image 27. Adenopathy extends throughout the superior mediastinum on  the RIGHT, displacing and compressing the IVC and extending to the base of the RIGHT cervical region. No axillary adenopathy. Unable to assess patency of the RIGHT subclavian vein. Lungs/Pleura: Small BILATERAL pleural effusions. Abnormal soft tissue/tumor surrounds the RIGHT and LEFT mainstem bronchi. Underlying emphysematous changes. Dependent atelectasis in the posterior lower lobes. No definite pulmonary mass. 4 mm RIGHT upper lobe nodule image 33. 3 mm RIGHT upper lobe nodule image 63. Calcified granuloma anterior RIGHT lung. Few additional scattered tiny BILATERAL pulmonary nodules. 6 mm LEFT upper lobe nodule image 25. No infiltrate or pneumothorax. Musculoskeletal: No acute osseous lesions. CT ABDOMEN PELVIS FINDINGS Hepatobiliary: Subtle liver lesions question metastases including an 11 mm LEFT lobe lesion image 52, 9 mm RIGHT lobe lesion image 55, LEFT lobe lesion superiorly 8 mm image 50. Gallbladder unremarkable. Pancreas: Mild peripancreatic edema. Peripancreatic adenopathy. No discrete pancreatic mass or ductal dilatation. Spleen: Normal appearance Adrenals/Urinary Tract: BILATERAL adrenal nodules 17 x 12 mm LEFT and 9 x 6 mm RIGHT. LEFT renal cysts largest 4.5 x 3.4 cm image 72. Kidneys, ureters, and bladder otherwise normal appearance. Stomach/Bowel: Scattered stool throughout colon. Sigmoid diverticulosis without evidence of diverticulitis. No definite gastric or bowel mass. Normal appendix. Vascular/Lymphatic: Atherosclerotic calcifications aorta without aneurysm. Extensive adenopathy and mesenteric up to 15 mm short axis image 78. Peripancreatic, periportal and portal  caval adenopathy up to 19 mm short axis image 62 and 16 mm image 56. Reproductive: Significant prostatic enlargement gland measuring 6.1 x 5.4 cm image 113. Other: Small amount of free fluid in pelvis. Significant edema in the mesenteric associated with mesenteric adenopathy. Multiple tumor nodules/implants in retroperitoneal fat in  the flanks bilaterally. Possible peritoneal nodules anterior pelvis image 100 and LEFT pelvis image 94. BILATERAL inguinal hernias containing fat. No free air. Musculoskeletal: No acute osseous findings. Lytic metastatic lesions at L2, L3, L5, LEFT iliac. Probable metastatic lesion S2 segment of sacrum. IMPRESSION: Extensive tumor involvement in the chest, abdomen, and pelvis, with extensive adenopathy in the mediastinum and RIGHT hilum, mesenteric, periportal/peripancreatic. Small BILATERAL pulmonary nodules question pulmonary metastases. Lytic metastatic lesions in lumbar spine and pelvis. Small hepatic and adrenal metastases as well as retroperitoneal tumor deposits bilaterally and question of peritoneal deposits in the pelvis. No definite primary tumor is localized. Small BILATERAL pleural effusions. Electronically Signed   By: Lavonia Dana M.D.   On: 12/02/2016 18:27   Ct Abdomen Pelvis W Contrast  Result Date: 12/02/2016 CLINICAL DATA:  Abnormal ultrasound showing liver masses, epigastric discomfort due to gas, lays, 15 pounds weight loss, loss of appetite, generalized weakness, type II diabetes mellitus, hypertension, former smoker EXAM: CT CHEST, ABDOMEN, AND PELVIS WITH CONTRAST TECHNIQUE: Multidetector CT imaging of the chest, abdomen and pelvis was performed following the standard protocol during bolus administration of intravenous contrast. Sagittal and coronal MPR images reconstructed from axial data set. CONTRAST:  62mL ISOVUE-300 IOPAMIDOL (ISOVUE-300) INJECTION 61% IV. Dilute oral contrast. COMPARISON:  Abdominal ultrasound 12/02/2016 FINDINGS: CT CHEST FINDINGS Cardiovascular: Atherosclerotic calcifications aorta, coronary arteries, proximal great vessels. Aorta normal caliber. Small pericardial effusion. Mild LEFT ventricular dilatation. Displacement and compression of the IVC by mediastinal adenopathy the remains patent. Mediastinum/Nodes: Moderate-sized hiatal hernia question mild wall  thickening of the midesophagus. Multiple enlarged mediastinal lymph nodes including a conglomerate nodal mass at the AP window 3.3 x 4.6 cm, pretracheal node 16 mm image 21, prevascular node 16 mm image 16, RIGHT superior mediastinal node 21 mm image 14, and necrotic subcarinal node 29 mm image 28. 17 mm RIGHT hilar node image 31. RIGHT superior hilar node 16 mm image 27. Adenopathy extends throughout the superior mediastinum on the RIGHT, displacing and compressing the IVC and extending to the base of the RIGHT cervical region. No axillary adenopathy. Unable to assess patency of the RIGHT subclavian vein. Lungs/Pleura: Small BILATERAL pleural effusions. Abnormal soft tissue/tumor surrounds the RIGHT and LEFT mainstem bronchi. Underlying emphysematous changes. Dependent atelectasis in the posterior lower lobes. No definite pulmonary mass. 4 mm RIGHT upper lobe nodule image 33. 3 mm RIGHT upper lobe nodule image 63. Calcified granuloma anterior RIGHT lung. Few additional scattered tiny BILATERAL pulmonary nodules. 6 mm LEFT upper lobe nodule image 25. No infiltrate or pneumothorax. Musculoskeletal: No acute osseous lesions. CT ABDOMEN PELVIS FINDINGS Hepatobiliary: Subtle liver lesions question metastases including an 11 mm LEFT lobe lesion image 52, 9 mm RIGHT lobe lesion image 55, LEFT lobe lesion superiorly 8 mm image 50. Gallbladder unremarkable. Pancreas: Mild peripancreatic edema. Peripancreatic adenopathy. No discrete pancreatic mass or ductal dilatation. Spleen: Normal appearance Adrenals/Urinary Tract: BILATERAL adrenal nodules 17 x 12 mm LEFT and 9 x 6 mm RIGHT. LEFT renal cysts largest 4.5 x 3.4 cm image 72. Kidneys, ureters, and bladder otherwise normal appearance. Stomach/Bowel: Scattered stool throughout colon. Sigmoid diverticulosis without evidence of diverticulitis. No definite gastric or bowel mass. Normal appendix. Vascular/Lymphatic: Atherosclerotic calcifications aorta  without aneurysm. Extensive  adenopathy and mesenteric up to 15 mm short axis image 78. Peripancreatic, periportal and portal caval adenopathy up to 19 mm short axis image 62 and 16 mm image 56. Reproductive: Significant prostatic enlargement gland measuring 6.1 x 5.4 cm image 113. Other: Small amount of free fluid in pelvis. Significant edema in the mesenteric associated with mesenteric adenopathy. Multiple tumor nodules/implants in retroperitoneal fat in the flanks bilaterally. Possible peritoneal nodules anterior pelvis image 100 and LEFT pelvis image 94. BILATERAL inguinal hernias containing fat. No free air. Musculoskeletal: No acute osseous findings. Lytic metastatic lesions at L2, L3, L5, LEFT iliac. Probable metastatic lesion S2 segment of sacrum. IMPRESSION: Extensive tumor involvement in the chest, abdomen, and pelvis, with extensive adenopathy in the mediastinum and RIGHT hilum, mesenteric, periportal/peripancreatic. Small BILATERAL pulmonary nodules question pulmonary metastases. Lytic metastatic lesions in lumbar spine and pelvis. Small hepatic and adrenal metastases as well as retroperitoneal tumor deposits bilaterally and question of peritoneal deposits in the pelvis. No definite primary tumor is localized. Small BILATERAL pleural effusions. Electronically Signed   By: Lavonia Dana M.D.   On: 12/02/2016 18:27   US Abdomen Limited Ruq  Result Date: 12/02/2016 CLINICAL DATA:  Abdominal pain and belching for the past month. 15 pound weight loss over the past month. Ex-smoker. EXAM: ULTRASOUND ABDOMEN LIMITED RIGHT UPPER QUADRANT COMPARISON:  None. FINDINGS: Gallbladder: No gallstones or wall thickening visualized. No sonographic Murphy sign noted by sonographer. Common bile duct: Diameter: 3.7 mm Liver: Multiple rounded and oval hypoechoic masses within the liver. The largest mass measures 1.5 cm in maximum diameter. IMPRESSION: Multiple liver masses, most likely representing metastatic disease. Otherwise, normal examination.  Electronically Signed   By: Claudie Revering M.D.   On: 12/02/2016 14:31    Labs:  CBC:  Recent Labs  12/02/16 1242 12/10/16 1046  WBC 12.1* 10.5  HGB 9.7* 8.7*  HCT 28.3* 25.6*  PLT 230 251    COAGS:  Recent Labs  12/10/16 1046  INR 1.22  APTT 32    BMP:  Recent Labs  12/02/16 1242  NA 139  K 3.4*  CL 105  CO2 26  GLUCOSE 189*  BUN 40*  CALCIUM 9.0  CREATININE 1.72*  GFRNONAA 36*  GFRAA 41*    LIVER FUNCTION TESTS:  Recent Labs  12/02/16 1242  BILITOT 0.6  AST 26  ALT 18  ALKPHOS 55  PROT 6.7  ALBUMIN 3.3*    Assessment and Plan:  For US guided biopsy of right supraclavicular lymphadenopathy today. Risks and benefits discussed with the patient including, but not limited to bleeding, infection, damage to adjacent structures or low yield requiring additional tests. All of the patient's questions were answered, patient is agreeable to proceed. Consent signed and in chart.  Thank you for this interesting consult.  I greatly enjoyed meeting Keith Chandler and look forward to participating in their care.  A copy of this report was sent to the requesting provider on this date.  Electronically Signed: Azzie Roup, MD 12/10/2016, 11:47 AM   I spent a total of 30 Minutes in face to face in clinical consultation, greater than 50% of which was counseling/coordinating care for lymph node biopsy.

## 2016-12-12 ENCOUNTER — Ambulatory Visit
Admission: RE | Admit: 2016-12-12 | Discharge: 2016-12-12 | Disposition: A | Discharge status: Home / Self Care | Payer: Medicare Other | Source: Ambulatory Visit | Attending: Oncology | Admitting: Oncology

## 2016-12-12 ENCOUNTER — Other Ambulatory Visit: Payer: Self-pay | Admitting: Anatomic Pathology & Clinical Pathology

## 2016-12-12 DIAGNOSIS — C3490 Malignant neoplasm of unspecified part of unspecified bronchus or lung: Secondary | ICD-10-CM | POA: Insufficient documentation

## 2016-12-12 DIAGNOSIS — C7951 Secondary malignant neoplasm of bone: Secondary | ICD-10-CM | POA: Insufficient documentation

## 2016-12-12 DIAGNOSIS — G319 Degenerative disease of nervous system, unspecified: Secondary | ICD-10-CM | POA: Diagnosis not present

## 2016-12-12 DIAGNOSIS — C799 Secondary malignant neoplasm of unspecified site: Secondary | ICD-10-CM

## 2016-12-12 DIAGNOSIS — G939 Disorder of brain, unspecified: Secondary | ICD-10-CM | POA: Insufficient documentation

## 2016-12-12 DIAGNOSIS — I6782 Cerebral ischemia: Secondary | ICD-10-CM | POA: Diagnosis not present

## 2016-12-12 LAB — SURGICAL PATHOLOGY

## 2016-12-12 LAB — GLUCOSE, CAPILLARY: GLUCOSE-CAPILLARY: 135 mg/dL — AB (ref 65–99)

## 2016-12-12 MED ORDER — FLUDEOXYGLUCOSE F - 18 (FDG) INJECTION
12.6700 | Freq: Once | INTRAVENOUS | Status: AC | PRN
  Administered 2016-12-12: 12.67 via INTRAVENOUS

## 2016-12-12 MED ORDER — GADOBENATE DIMEGLUMINE 529 MG/ML IV SOLN
10.0000 mL | Freq: Once | INTRAVENOUS | Status: AC | PRN
  Administered 2016-12-12: 9 mL via INTRAVENOUS

## 2016-12-12 NOTE — Progress Notes (Signed)
[place]  [phone] [phone]  ID: Keith Chandler OB: 06-22-1934  MR#: [id number]  UMP#:[id number]  Patient Care Team: [facility] as PCP - General (General Practice)  CHIEF COMPLAINT: Stage IVB adenocarcinoma of the lung  INTERVAL HISTORY: Patient returns to clinic today for hospital follow-up, discussion of his pathology results, and treatment planning. His performance status has declined slightly and he has persistent weakness and fatigue. He has a poor appetite associated with nausea and weight loss. He has a chronic cough, but denies any chest pain or shortness of breath. He has no neurologic complaints. He denies any recent fevers. He denies any vomiting, constipation, or diarrhea. He has no urinary complaints. Patient otherwise feels well and offers no further specific complaints.  REVIEW OF SYSTEMS:   Review of Systems  Constitutional: Positive for malaise/fatigue and weight loss. Negative for fever.  Respiratory: Positive for cough. Negative for hemoptysis and shortness of breath.   Cardiovascular: Negative.  Negative for chest pain and leg swelling.  Gastrointestinal: Positive for nausea. Negative for abdominal pain, constipation, diarrhea and vomiting.  Genitourinary: Negative.   Musculoskeletal: Negative.   Skin: Negative.  Negative for rash.  Neurological: Positive for weakness.  Psychiatric/Behavioral: Negative.  The patient is not nervous/anxious.     As per HPI. Otherwise, a complete review of systems is negative.  PAST MEDICAL HISTORY: Past Medical History:  Diagnosis Date  . Diabetes mellitus without complication ([place])   . Hypertension   . Neuropathy     PAST SURGICAL HISTORY: Past Surgical History:  Procedure Laterality Date  . CATARACT EXTRACTION, BILATERAL      FAMILY HISTORY: Family History  Problem Relation Age of Onset  . Family history unknown: Yes    ADVANCED DIRECTIVES (Y/N):   N  HEALTH MAINTENANCE: Social History  Substance Use Topics  . Smoking status: Former Smoker    Packs/day: 2.00    Years: 45.00    Types: Cigarettes  . Smokeless tobacco: Former Systems developer    Quit date: 05/21/1992  . Alcohol use No     Colonoscopy:  PAP:  Bone density:  Lipid panel:  Allergies  Allergen Reactions  . Sulfa Antibiotics     Current Outpatient Prescriptions  Medication Sig Dispense Refill  . aspirin EC 81 MG tablet Take 81 mg by mouth daily.    Marland Kitchen atorvastatin (LIPITOR) 10 MG tablet Take 10 mg by mouth daily.    Marland Kitchen gabapentin (NEURONTIN) 600 MG tablet Take 600 mg by mouth 3 (three) times daily as needed.    . megestrol (MEGACE) 40 MG tablet Take 1 tablet (40 mg total) by mouth daily. 30 tablet 1  . omeprazole (PRILOSEC) 20 MG capsule Take 1 capsule (20 mg total) by mouth 2 (two) times daily before a meal. 60 capsule 1  . dexamethasone (DECADRON) 4 MG tablet Take 1 tablet (4 mg total) by mouth daily. 30 tablet 0  . folic acid (FOLVITE) 1 MG tablet Take 1 tablet (1 mg total) by mouth daily. 90 tablet 3  . lidocaine-prilocaine (EMLA) cream Apply to affected area once 30 g 3  . ondansetron (ZOFRAN) 8 MG tablet Take 1 tablet (8 mg total) by mouth every 8 (eight) hours as needed. 60 tablet 2  . prochlorperazine (COMPAZINE) 10 MG tablet Take 1 tablet (10 mg total) by mouth every 6 (six) hours as needed (Nausea or vomiting). 60 tablet 2   No current facility-administered medications for this visit.     OBJECTIVE:  Vitals:   12/13/16 1532  BP: 105/70  Pulse: 97  Resp: 18  Temp: 97.8 F (36.6 C)     Body mass index is 28.44 kg/m.    ECOG FS:1 - Symptomatic but completely ambulatory  General: Well-developed, well-nourished, no acute distress. Eyes: Pink conjunctiva, anicteric sclera. HEENT: Normocephalic, moist mucous membranes, clear oropharnyx. Lungs: Clear to auscultation bilaterally. Heart: Regular rate and rhythm. No rubs, murmurs, or gallops. Abdomen: Soft,  nontender, nondistended. No organomegaly noted, normoactive bowel sounds. Musculoskeletal: No edema, cyanosis, or clubbing. Neuro: Alert, answering all questions appropriately. Cranial nerves grossly intact. Skin: No rashes or petechiae noted. Psych: Normal affect. Lymphatics: No cervical, calvicular, axillary or inguinal LAD.   LAB RESULTS:  Lab Results  Component Value Date   NA 143 12/13/2016   K 3.1 (L) 12/13/2016   CL 109 12/13/2016   CO2 24 12/13/2016   GLUCOSE 149 (H) 12/13/2016   BUN 34 (H) 12/13/2016   CREATININE 1.45 (H) 12/13/2016   CALCIUM 8.5 (L) 12/13/2016   PROT 6.6 12/13/2016   ALBUMIN 3.1 (L) 12/13/2016   AST 21 12/13/2016   ALT 15 (L) 12/13/2016   ALKPHOS 57 12/13/2016   BILITOT 0.4 12/13/2016   GFRNONAA 44 (L) 12/13/2016   GFRAA 51 (L) 12/13/2016    Lab Results  Component Value Date   WBC 10.9 (H) 12/13/2016   NEUTROABS 8.0 (H) 12/13/2016   HGB 8.7 (L) 12/13/2016   HCT 26.2 (L) 12/13/2016   MCV 88.4 12/13/2016   PLT 276 12/13/2016     STUDIES: Dg Chest 2 View  Result Date: 12/02/2016 CLINICAL DATA:  Fatigue, weakness, chest pressure and cough EXAM: CHEST  2 VIEW COMPARISON:  None. FINDINGS: Mild cardiomegaly with vascular congestion. Chronic nonspecific interstitial prominence without definite edema. No significant effusion or pneumothorax. No focal pneumonia, collapse or consolidation. Trachea is midline. Atherosclerosis of aorta. Degenerative changes of the spine. No compression fracture. IMPRESSION: Cardiomegaly with vascular congestion Mild chronic interstitial changes suspected. No definite superimposed CHF or pneumonia. Electronically Signed   By: Jerilynn Mages.  Shick M.D.   On: 12/02/2016 12:51   Ct Chest W Contrast  Result Date: 12/02/2016 CLINICAL DATA:  Abnormal ultrasound showing liver masses, epigastric discomfort due to gas, lays, 15 pounds weight loss, loss of appetite, generalized weakness, type II diabetes mellitus, hypertension, former smoker  EXAM: CT CHEST, ABDOMEN, AND PELVIS WITH CONTRAST TECHNIQUE: Multidetector CT imaging of the chest, abdomen and pelvis was performed following the standard protocol during bolus administration of intravenous contrast. Sagittal and coronal MPR images reconstructed from axial data set. CONTRAST:  34mL ISOVUE-300 IOPAMIDOL (ISOVUE-300) INJECTION 61% IV. Dilute oral contrast. COMPARISON:  Abdominal ultrasound 12/02/2016 FINDINGS: CT CHEST FINDINGS Cardiovascular: Atherosclerotic calcifications aorta, coronary arteries, proximal great vessels. Aorta normal caliber. Small pericardial effusion. Mild LEFT ventricular dilatation. Displacement and compression of the IVC by mediastinal adenopathy the remains patent. Mediastinum/Nodes: Moderate-sized hiatal hernia question mild wall thickening of the midesophagus. Multiple enlarged mediastinal lymph nodes including a conglomerate nodal mass at the AP window 3.3 x 4.6 cm, pretracheal node 16 mm image 21, prevascular node 16 mm image 16, RIGHT superior mediastinal node 21 mm image 14, and necrotic subcarinal node 29 mm image 28. 17 mm RIGHT hilar node image 31. RIGHT superior hilar node 16 mm image 27. Adenopathy extends throughout the superior mediastinum on the RIGHT, displacing and compressing the IVC and extending to the base of the RIGHT cervical region. No axillary adenopathy. Unable to assess patency of the RIGHT subclavian vein.  Lungs/Pleura: Small BILATERAL pleural effusions. Abnormal soft tissue/tumor surrounds the RIGHT and LEFT mainstem bronchi. Underlying emphysematous changes. Dependent atelectasis in the posterior lower lobes. No definite pulmonary mass. 4 mm RIGHT upper lobe nodule image 33. 3 mm RIGHT upper lobe nodule image 63. Calcified granuloma anterior RIGHT lung. Few additional scattered tiny BILATERAL pulmonary nodules. 6 mm LEFT upper lobe nodule image 25. No infiltrate or pneumothorax. Musculoskeletal: No acute osseous lesions. CT ABDOMEN PELVIS FINDINGS  Hepatobiliary: Subtle liver lesions question metastases including an 11 mm LEFT lobe lesion image 52, 9 mm RIGHT lobe lesion image 55, LEFT lobe lesion superiorly 8 mm image 50. Gallbladder unremarkable. Pancreas: Mild peripancreatic edema. Peripancreatic adenopathy. No discrete pancreatic mass or ductal dilatation. Spleen: Normal appearance Adrenals/Urinary Tract: BILATERAL adrenal nodules 17 x 12 mm LEFT and 9 x 6 mm RIGHT. LEFT renal cysts largest 4.5 x 3.4 cm image 72. Kidneys, ureters, and bladder otherwise normal appearance. Stomach/Bowel: Scattered stool throughout colon. Sigmoid diverticulosis without evidence of diverticulitis. No definite gastric or bowel mass. Normal appendix. Vascular/Lymphatic: Atherosclerotic calcifications aorta without aneurysm. Extensive adenopathy and mesenteric up to 15 mm short axis image 78. Peripancreatic, periportal and portal caval adenopathy up to 19 mm short axis image 62 and 16 mm image 56. Reproductive: Significant prostatic enlargement gland measuring 6.1 x 5.4 cm image 113. Other: Small amount of free fluid in pelvis. Significant edema in the mesenteric associated with mesenteric adenopathy. Multiple tumor nodules/implants in retroperitoneal fat in the flanks bilaterally. Possible peritoneal nodules anterior pelvis image 100 and LEFT pelvis image 94. BILATERAL inguinal hernias containing fat. No free air. Musculoskeletal: No acute osseous findings. Lytic metastatic lesions at L2, L3, L5, LEFT iliac. Probable metastatic lesion S2 segment of sacrum. IMPRESSION: Extensive tumor involvement in the chest, abdomen, and pelvis, with extensive adenopathy in the mediastinum and RIGHT hilum, mesenteric, periportal/peripancreatic. Small BILATERAL pulmonary nodules question pulmonary metastases. Lytic metastatic lesions in lumbar spine and pelvis. Small hepatic and adrenal metastases as well as retroperitoneal tumor deposits bilaterally and question of peritoneal deposits in the  pelvis. No definite primary tumor is localized. Small BILATERAL pleural effusions. Electronically Signed   By: Lavonia Dana M.D.   On: 12/02/2016 18:27   Mr Jeri Cos UD Contrast  Result Date: 12/12/2016 CLINICAL DATA:  Initial staging of metastatic cancer. EXAM: MRI HEAD WITHOUT AND WITH CONTRAST TECHNIQUE: Multiplanar, multiecho pulse sequences of the brain and surrounding structures were obtained without and with intravenous contrast. CONTRAST:  67mL MULTIHANCE GADOBENATE DIMEGLUMINE 529 MG/ML IV SOLN COMPARISON:  None. FINDINGS: Brain: There is no evidence of acute infarct, intracranial hemorrhage, midline shift, or extra-axial fluid collection. There is moderate generalized cerebral atrophy. Scattered foci of cerebral white matter T2 hyperintensity are nonspecific but compatible with mild chronic small vessel ischemic disease. There is a 9 mm enhancing lesion with minimal surrounding edema in the right cerebellar hemisphere (series 12, image 36). A 6 mm enhancing lesion in the medial left occipital lobe also has minimal surrounding edema (series 12, image 72). There is a 3 mm enhancing lesion in the posterior left frontal operculum without edema (series 12, image 88). Vascular: Major intracranial vascular flow voids are preserved. Skull and upper cervical spine: Approximately 2 cm lesion in the clivus, predominantly right of midline. Additional lesion involving C2 on the left. Sinuses/Orbits: Bilateral cataract extraction. No significant sinus disease. Other: None. IMPRESSION: 1. Three subcentimeter enhancing brain lesions consistent with metastases. Minimal edema. 2. Osseous metastases in the clivus and C2. 3. Mild chronic small  vessel ischemic disease and moderate cerebral atrophy. Electronically Signed   By: Logan Bores M.D.   On: 12/12/2016 11:34   Ct Abdomen Pelvis W Contrast  Result Date: 12/02/2016 CLINICAL DATA:  Abnormal ultrasound showing liver masses, epigastric discomfort due to gas, lays, 15  pounds weight loss, loss of appetite, generalized weakness, type II diabetes mellitus, hypertension, former smoker EXAM: CT CHEST, ABDOMEN, AND PELVIS WITH CONTRAST TECHNIQUE: Multidetector CT imaging of the chest, abdomen and pelvis was performed following the standard protocol during bolus administration of intravenous contrast. Sagittal and coronal MPR images reconstructed from axial data set. CONTRAST:  55mL ISOVUE-300 IOPAMIDOL (ISOVUE-300) INJECTION 61% IV. Dilute oral contrast. COMPARISON:  Abdominal ultrasound 12/02/2016 FINDINGS: CT CHEST FINDINGS Cardiovascular: Atherosclerotic calcifications aorta, coronary arteries, proximal great vessels. Aorta normal caliber. Small pericardial effusion. Mild LEFT ventricular dilatation. Displacement and compression of the IVC by mediastinal adenopathy the remains patent. Mediastinum/Nodes: Moderate-sized hiatal hernia question mild wall thickening of the midesophagus. Multiple enlarged mediastinal lymph nodes including a conglomerate nodal mass at the AP window 3.3 x 4.6 cm, pretracheal node 16 mm image 21, prevascular node 16 mm image 16, RIGHT superior mediastinal node 21 mm image 14, and necrotic subcarinal node 29 mm image 28. 17 mm RIGHT hilar node image 31. RIGHT superior hilar node 16 mm image 27. Adenopathy extends throughout the superior mediastinum on the RIGHT, displacing and compressing the IVC and extending to the base of the RIGHT cervical region. No axillary adenopathy. Unable to assess patency of the RIGHT subclavian vein. Lungs/Pleura: Small BILATERAL pleural effusions. Abnormal soft tissue/tumor surrounds the RIGHT and LEFT mainstem bronchi. Underlying emphysematous changes. Dependent atelectasis in the posterior lower lobes. No definite pulmonary mass. 4 mm RIGHT upper lobe nodule image 33. 3 mm RIGHT upper lobe nodule image 63. Calcified granuloma anterior RIGHT lung. Few additional scattered tiny BILATERAL pulmonary nodules. 6 mm LEFT upper lobe  nodule image 25. No infiltrate or pneumothorax. Musculoskeletal: No acute osseous lesions. CT ABDOMEN PELVIS FINDINGS Hepatobiliary: Subtle liver lesions question metastases including an 11 mm LEFT lobe lesion image 52, 9 mm RIGHT lobe lesion image 55, LEFT lobe lesion superiorly 8 mm image 50. Gallbladder unremarkable. Pancreas: Mild peripancreatic edema. Peripancreatic adenopathy. No discrete pancreatic mass or ductal dilatation. Spleen: Normal appearance Adrenals/Urinary Tract: BILATERAL adrenal nodules 17 x 12 mm LEFT and 9 x 6 mm RIGHT. LEFT renal cysts largest 4.5 x 3.4 cm image 72. Kidneys, ureters, and bladder otherwise normal appearance. Stomach/Bowel: Scattered stool throughout colon. Sigmoid diverticulosis without evidence of diverticulitis. No definite gastric or bowel mass. Normal appendix. Vascular/Lymphatic: Atherosclerotic calcifications aorta without aneurysm. Extensive adenopathy and mesenteric up to 15 mm short axis image 78. Peripancreatic, periportal and portal caval adenopathy up to 19 mm short axis image 62 and 16 mm image 56. Reproductive: Significant prostatic enlargement gland measuring 6.1 x 5.4 cm image 113. Other: Small amount of free fluid in pelvis. Significant edema in the mesenteric associated with mesenteric adenopathy. Multiple tumor nodules/implants in retroperitoneal fat in the flanks bilaterally. Possible peritoneal nodules anterior pelvis image 100 and LEFT pelvis image 94. BILATERAL inguinal hernias containing fat. No free air. Musculoskeletal: No acute osseous findings. Lytic metastatic lesions at L2, L3, L5, LEFT iliac. Probable metastatic lesion S2 segment of sacrum. IMPRESSION: Extensive tumor involvement in the chest, abdomen, and pelvis, with extensive adenopathy in the mediastinum and RIGHT hilum, mesenteric, periportal/peripancreatic. Small BILATERAL pulmonary nodules question pulmonary metastases. Lytic metastatic lesions in lumbar spine and pelvis. Small hepatic and  adrenal  metastases as well as retroperitoneal tumor deposits bilaterally and question of peritoneal deposits in the pelvis. No definite primary tumor is localized. Small BILATERAL pleural effusions. Electronically Signed   By: Lavonia Dana M.D.   On: 12/02/2016 18:27   Nm Pet Image Initial (pi) Skull Base To Thigh  Result Date: 12/12/2016 CLINICAL DATA:  Initial treatment strategy for lung cancer. EXAM: NUCLEAR MEDICINE PET SKULL BASE TO THIGH TECHNIQUE: 12.7 mCi F-18 FDG was injected intravenously. Full-ring PET imaging was performed from the skull base to thigh after the radiotracer. CT data was obtained and used for attenuation correction and anatomic localization. FASTING BLOOD GLUCOSE:  Value: 135 mg/dl COMPARISON:  CT scans dated 12/02/2016 FINDINGS: NECK The patient has known intracranial metastatic lesions are not well observed although the clivus metastatic focus is seen. Left station IIa lymph node measuring 9 mm in short axis on image 33/ 3 has maximum standard uptake value 4.7. Right level III lymph node about the level of the cricoid measures 7 mm in short axis on image 46/3 with maximum SUV 7.2. Left level IV masses extending into the paraspinal space and also extending into the supraclavicular space, with a component posteriorly having maximum SUV of 12.5. Left level V lymph node measures 1.2 cm in short axis on image 36/ 3 with maximum SUV 3.9. Small subcutaneous hypermetabolic lymph node along the left lower neck measures 8 mm in short axis on image 34/ 3 with maximum SUV 3.2. CHEST Extensive confluent supraclavicular, right paratracheal, left paratracheal, paraesophageal, anterior mediastinal, AP window, bilateral hilar, subcarinal, and right infrahilar adenopathy noted along with some mildly hypermetabolic left internal mammary adenopathy. Index right lower paratracheal node 1.6 cm in short axis on image 83/3 with maximum SUV 12.2. A small immediately subcutaneous left axillary lymph node  measures 1.0 cm in short axis on image 79/3 with maximum SUV 5.5. Small bilateral pleural effusions are present. There is associated passive atelectasis. Severe centrilobular emphysema. A 3 mm right apical pulmonary nodule on image 67/3 is not hypermetabolic but is well below sensitive PET-CT size thresholds. Moderate-sized hiatal hernia. Cardiomegaly with small pericardial effusion. ABDOMEN/PELVIS Several faint hypermetabolic foci are present in the liver suspicious for metastatic disease. These are poorly correlated on the CT data, but an inferior right hepatic lobe lesion has a maximum SUV of 6.0 near the right hepatic tip, well above background liver activity of approximately 3.3. There is extensive hypermetabolic porta hepatis, peripancreatic, mesenteric, and periaortic adenopathy along with scattered hypermetabolic nodules in the posterior perirenal space. An index peripancreatic lymph node measuring 1.5 cm in short axis on image 139/3 (Previously measured 1.6 cm) has a maximum SUV of 9.2. Bilateral small adrenal masses are present, right adrenal gland maximum SUV 7.0, left adrenal gland maximum SUV 8.9. Pelvic ascites noted with several small hypermetabolic peritoneal nodules in the pelvis. Prostatomegaly. Aortoiliac atherosclerotic vascular disease. Sigmoid colon diverticulosis. SKELETON Musculoskeletal hypermetabolic lesions include the clivus, left C2 vertebra, left proximal humerus, right fifth rib, L2 vertebral body and pedicle, right L3 transverse process, left L5 vertebral body, the S1 vertebra and lower sacral regions, both iliac bones, the left anterior inferior acetabulum, the right proximal femur adjacent to the lesser trochanter, and several questionable foci in the gluteus maximus muscles. Several other more subtle rib and vertebral lesions are present. Index metastatic lesion eccentric to the left the L5 vertebral level has maximum SUV 8.3. IMPRESSION: 1. Extensive widespread metastatic disease  including the neck, mediastinum, hila, porta hepatis, mesentery, retroperitoneum, adrenal glands,  liver, subcutaneous nodal involvement in several locations, and scattered osseous metastatic disease, including the clivus. 2. Other imaging findings of potential clinical significance: Small bilateral pleural effusions with passive atelectasis. Small pericardial effusion. Cardiomegaly. Moderate-sized hiatal hernia. Aortic Atherosclerosis (ICD10-I70.0) and Emphysema (ICD10-J43.9). Prostatomegaly. Sigmoid colon diverticulosis. Electronically Signed   By: Van Clines M.D.   On: 12/12/2016 12:02   US Biopsy  Result Date: 12/10/2016 CLINICAL DATA:  Lymphadenopathy in the right supraclavicular region, chest and abdomen. Additional liver lesions, lytic bone lesions and pulmonary nodules. The patient presents for biopsy of an enlarged right supraclavicular lymph node. EXAM: ULTRASOUND GUIDED CORE BIOPSY OF RIGHT SUPRACLAVICULAR LYMPH NODE MASS MEDICATIONS: 1.0 mg IV Versed; 50 mcg IV Fentanyl Total Moderate Sedation Time: 19 minutes. The patient's level of consciousness and physiologic status were continuously monitored during the procedure by Radiology nursing. PROCEDURE: The procedure, risks, benefits, and alternatives were explained to the patient. Questions regarding the procedure were encouraged and answered. The patient understands and consents to the procedure. A time out was performed prior to initiating the procedure. The right neck was prepped with chlorhexidine in a sterile fashion, and a sterile drape was applied covering the operative field. A sterile gown and sterile gloves were used for the procedure. Local anesthesia was provided with 1% Lidocaine. Under ultrasound guidance, a total of 5 separate 18 gauge core biopsy samples were obtained of an enlarged right supraclavicular lymph node. Three samples were submitted in formalin. Two samples were submitted on Telfa soaked with sterile saline.  COMPLICATIONS: None. FINDINGS: An enlarged right supraclavicular lymph node is irregular in shape and measures roughly 4.1 x 2.1 x 4.1 cm. Solid tissue was obtained from different portions of the lymph node. IMPRESSION: Ultrasound-guided core biopsy performed of an enlarged right supraclavicular lymph node. Electronically Signed   By: Aletta Edouard M.D.   On: 12/10/2016 14:41   US Abdomen Limited Ruq  Result Date: 12/02/2016 CLINICAL DATA:  Abdominal pain and belching for the past month. 15 pound weight loss over the past month. Ex-smoker. EXAM: ULTRASOUND ABDOMEN LIMITED RIGHT UPPER QUADRANT COMPARISON:  None. FINDINGS: Gallbladder: No gallstones or wall thickening visualized. No sonographic Murphy sign noted by sonographer. Common bile duct: Diameter: 3.7 mm Liver: Multiple rounded and oval hypoechoic masses within the liver. The largest mass measures 1.5 cm in maximum diameter. IMPRESSION: Multiple liver masses, most likely representing metastatic disease. Otherwise, normal examination. Electronically Signed   By: Claudie Revering M.D.   On: 12/02/2016 14:31    ASSESSMENT: Stage IVB adenocarcinoma of the lung  PLAN:    1. Stage IVB adenocarcinoma of the lung: Imaging results reviewed independently and reported as above with widespread metastatic disease including 3 brain lesions. Pathology discussed with pathologist. Despite patient's declining performance status, he wishes to pursue aggressive treatment. PDL 1 testing is pending, but patient will receive Keytruda plus pemetrexed every 3 weeks for 4 cycles and then continue with Keytruda until progression of disease. If patient tolerates treatment well, can add carboplatinum to this regimen for cycle 2. Patient will require port placement. Return to clinic on Jan 15, 2017 for further evaluation and consideration of cycle 1. Patient will then return the following week to assess his toleration of treatment and to determine if carboplatinum can be  added. 2. Brain metastasis: Patient was given a referral to radiation oncology. 3. Poor appetite/nausea: Patient was given a prescription for 4 mg Decadron and dietary referral. 4. Bony metastasis: Patient will require Zometa on the odd numbered cycles.  5. Iron deficiency anemia: Will give 2 doses of IV Feraheme over the next several weeks.  Approximately 30 minutes was spent in discussion of which greater than 50% was consultation.  Patient expressed understanding and was in agreement with this plan. He also understands that He can call clinic at any time with any questions, concerns, or complaints.   Cancer Staging Adenocarcinoma, lung, unspecified laterality [facility] [facility] Staging form: Lung, AJCC 8th Edition - Clinical stage from 12/12/2016: Stage IVB (cTX, cN3, pM1c) - Signed by Lloyd Huger, MD on 12/15/2016   Lloyd Huger, MD   12/15/2016 7:25 AM

## 2016-12-13 ENCOUNTER — Encounter: Payer: Self-pay | Admitting: Oncology

## 2016-12-13 ENCOUNTER — Inpatient Hospital Stay: Payer: Medicare Other

## 2016-12-13 ENCOUNTER — Encounter: Payer: Self-pay | Admitting: *Deleted

## 2016-12-13 ENCOUNTER — Inpatient Hospital Stay: Payer: Medicare Other | Attending: Oncology | Admitting: Oncology

## 2016-12-13 VITALS — BP 105/70 | HR 97 | Temp 97.8°F | Resp 18 | Wt 176.2 lb

## 2016-12-13 DIAGNOSIS — R5381 Other malaise: Secondary | ICD-10-CM

## 2016-12-13 DIAGNOSIS — J90 Pleural effusion, not elsewhere classified: Secondary | ICD-10-CM | POA: Insufficient documentation

## 2016-12-13 DIAGNOSIS — R630 Anorexia: Secondary | ICD-10-CM | POA: Diagnosis not present

## 2016-12-13 DIAGNOSIS — Z7189 Other specified counseling: Secondary | ICD-10-CM

## 2016-12-13 DIAGNOSIS — E860 Dehydration: Secondary | ICD-10-CM | POA: Insufficient documentation

## 2016-12-13 DIAGNOSIS — C3490 Malignant neoplasm of unspecified part of unspecified bronchus or lung: Secondary | ICD-10-CM

## 2016-12-13 DIAGNOSIS — J439 Emphysema, unspecified: Secondary | ICD-10-CM | POA: Diagnosis not present

## 2016-12-13 DIAGNOSIS — I700 Atherosclerosis of aorta: Secondary | ICD-10-CM | POA: Insufficient documentation

## 2016-12-13 DIAGNOSIS — I119 Hypertensive heart disease without heart failure: Secondary | ICD-10-CM | POA: Diagnosis not present

## 2016-12-13 DIAGNOSIS — R110 Nausea: Secondary | ICD-10-CM | POA: Insufficient documentation

## 2016-12-13 DIAGNOSIS — D509 Iron deficiency anemia, unspecified: Secondary | ICD-10-CM | POA: Insufficient documentation

## 2016-12-13 DIAGNOSIS — R590 Localized enlarged lymph nodes: Secondary | ICD-10-CM | POA: Diagnosis not present

## 2016-12-13 DIAGNOSIS — R5383 Other fatigue: Secondary | ICD-10-CM | POA: Insufficient documentation

## 2016-12-13 DIAGNOSIS — C7951 Secondary malignant neoplasm of bone: Secondary | ICD-10-CM | POA: Diagnosis not present

## 2016-12-13 DIAGNOSIS — R109 Unspecified abdominal pain: Secondary | ICD-10-CM | POA: Diagnosis not present

## 2016-12-13 DIAGNOSIS — Z7982 Long term (current) use of aspirin: Secondary | ICD-10-CM | POA: Insufficient documentation

## 2016-12-13 DIAGNOSIS — R634 Abnormal weight loss: Secondary | ICD-10-CM | POA: Insufficient documentation

## 2016-12-13 DIAGNOSIS — C7970 Secondary malignant neoplasm of unspecified adrenal gland: Secondary | ICD-10-CM | POA: Insufficient documentation

## 2016-12-13 DIAGNOSIS — Z87891 Personal history of nicotine dependence: Secondary | ICD-10-CM | POA: Insufficient documentation

## 2016-12-13 DIAGNOSIS — J8410 Pulmonary fibrosis, unspecified: Secondary | ICD-10-CM | POA: Diagnosis not present

## 2016-12-13 DIAGNOSIS — R531 Weakness: Secondary | ICD-10-CM | POA: Diagnosis not present

## 2016-12-13 DIAGNOSIS — I517 Cardiomegaly: Secondary | ICD-10-CM | POA: Diagnosis not present

## 2016-12-13 DIAGNOSIS — C7931 Secondary malignant neoplasm of brain: Secondary | ICD-10-CM | POA: Diagnosis not present

## 2016-12-13 DIAGNOSIS — N400 Enlarged prostate without lower urinary tract symptoms: Secondary | ICD-10-CM

## 2016-12-13 DIAGNOSIS — Z79899 Other long term (current) drug therapy: Secondary | ICD-10-CM

## 2016-12-13 DIAGNOSIS — N281 Cyst of kidney, acquired: Secondary | ICD-10-CM

## 2016-12-13 DIAGNOSIS — R1310 Dysphagia, unspecified: Secondary | ICD-10-CM | POA: Insufficient documentation

## 2016-12-13 DIAGNOSIS — K5730 Diverticulosis of large intestine without perforation or abscess without bleeding: Secondary | ICD-10-CM | POA: Insufficient documentation

## 2016-12-13 DIAGNOSIS — R160 Hepatomegaly, not elsewhere classified: Secondary | ICD-10-CM | POA: Diagnosis not present

## 2016-12-13 DIAGNOSIS — I313 Pericardial effusion (noninflammatory): Secondary | ICD-10-CM | POA: Insufficient documentation

## 2016-12-13 DIAGNOSIS — K4020 Bilateral inguinal hernia, without obstruction or gangrene, not specified as recurrent: Secondary | ICD-10-CM | POA: Insufficient documentation

## 2016-12-13 DIAGNOSIS — G319 Degenerative disease of nervous system, unspecified: Secondary | ICD-10-CM | POA: Diagnosis not present

## 2016-12-13 DIAGNOSIS — K449 Diaphragmatic hernia without obstruction or gangrene: Secondary | ICD-10-CM | POA: Insufficient documentation

## 2016-12-13 DIAGNOSIS — E119 Type 2 diabetes mellitus without complications: Secondary | ICD-10-CM | POA: Insufficient documentation

## 2016-12-13 LAB — COMPREHENSIVE METABOLIC PANEL
ALBUMIN: 3.1 g/dL — AB (ref 3.5–5.0)
ALK PHOS: 57 U/L (ref 38–126)
ALT: 15 U/L — AB (ref 17–63)
AST: 21 U/L (ref 15–41)
Anion gap: 10 (ref 5–15)
BILIRUBIN TOTAL: 0.4 mg/dL (ref 0.3–1.2)
BUN: 34 mg/dL — AB (ref 6–20)
CALCIUM: 8.5 mg/dL — AB (ref 8.9–10.3)
CO2: 24 mmol/L (ref 22–32)
Chloride: 109 mmol/L (ref 101–111)
Creatinine, Ser: 1.45 mg/dL — ABNORMAL HIGH (ref 0.61–1.24)
GFR calc Af Amer: 51 mL/min — ABNORMAL LOW (ref >60)
GFR calc non Af Amer: 44 mL/min — ABNORMAL LOW (ref >60)
GLUCOSE: 149 mg/dL — AB (ref 65–99)
Potassium: 3.1 mmol/L — ABNORMAL LOW (ref 3.5–5.1)
SODIUM: 143 mmol/L (ref 135–145)
TOTAL PROTEIN: 6.6 g/dL (ref 6.5–8.1)

## 2016-12-13 LAB — CBC WITH DIFFERENTIAL/PLATELET
BASOS ABS: 0.1 10*3/uL (ref 0–0.1)
BASOS PCT: 1 %
EOS ABS: 0.8 10*3/uL — AB (ref 0–0.7)
Eosinophils Relative: 8 %
HEMATOCRIT: 26.2 % — AB (ref 40.0–52.0)
HEMOGLOBIN: 8.7 g/dL — AB (ref 13.0–18.0)
Lymphocytes Relative: 11 %
Lymphs Abs: 1.2 10*3/uL (ref 1.0–3.6)
MCH: 29.4 pg (ref 26.0–34.0)
MCHC: 33.2 g/dL (ref 32.0–36.0)
MCV: 88.4 fL (ref 80.0–100.0)
MONOS PCT: 6 %
Monocytes Absolute: 0.7 10*3/uL (ref 0.2–1.0)
NEUTROS ABS: 8 10*3/uL — AB (ref 1.4–6.5)
NEUTROS PCT: 74 %
Platelets: 276 10*3/uL (ref 150–440)
RBC: 2.97 MIL/uL — AB (ref 4.40–5.90)
RDW: 13.9 % (ref 11.5–14.5)
WBC: 10.9 10*3/uL — AB (ref 3.8–10.6)

## 2016-12-13 LAB — IRON AND TIBC
Iron: 18 ug/dL — ABNORMAL LOW (ref 45–182)
Saturation Ratios: 9 % — ABNORMAL LOW (ref 17.9–39.5)
TIBC: 206 ug/dL — AB (ref 250–450)
UIBC: 188 ug/dL

## 2016-12-13 LAB — FERRITIN: Ferritin: 175 ng/mL (ref 24–336)

## 2016-12-13 MED ORDER — DEXAMETHASONE 4 MG PO TABS: 4.0000 mg | ORAL_TABLET | Freq: Every day | ORAL | 0 refills | Status: AC

## 2016-12-13 NOTE — Progress Notes (Signed)
Patient is here for follow up. He mentions he has no appetite even with the megace.

## 2016-12-14 ENCOUNTER — Telehealth (INDEPENDENT_AMBULATORY_CARE_PROVIDER_SITE_OTHER): Payer: Self-pay

## 2016-12-14 NOTE — Progress Notes (Signed)
  Oncology Nurse Navigator Documentation  Navigator Location: [facility] (12/13/16 1600)   )Navigator Encounter Type: Follow-up Appt (12/13/16 1600)   Abnormal Finding Date: 12/02/16 (12/13/16 1600) Confirmed Diagnosis Date: 12/12/16 (12/13/16 1600)                   Barriers/Navigation Needs: Coordination of Care (12/13/16 1600)   Interventions: Coordination of Care (12/13/16 1600)   Coordination of Care: Appts (12/13/16 1600)        Acuity: Level 2 (12/13/16 1600)   Acuity Level 2: Educational needs;Ongoing guidance and education throughout treatment as needed (12/13/16 1600)    Met with patient during hospital follow up appointment with Dr. Grayland Ormond. All questions answered at the time of visit. Pt and family given educational materials regarding diagnosis. Given materials regarding supportive services. All upcoming appts reviewed with pt's family. Contact info given and informed to contact me if have any further questions or needs. Pt and family verbalized understanding.   Time Spent with Patient: 60 (12/13/16 1600)

## 2016-12-14 NOTE — Telephone Encounter (Signed)
Left a message for patient to return my call so he can be scheduled for a port placement.

## 2016-12-15 DIAGNOSIS — Z7189 Other specified counseling: Secondary | ICD-10-CM | POA: Insufficient documentation

## 2016-12-15 MED ORDER — FOLIC ACID 1 MG PO TABS: 1.0000 mg | ORAL_TABLET | Freq: Every day | ORAL | 3 refills | Status: DC

## 2016-12-15 MED ORDER — LIDOCAINE-PRILOCAINE 2.5-2.5 % EX CREA: TOPICAL_CREAM | CUTANEOUS | 3 refills | Status: DC

## 2016-12-15 MED ORDER — ONDANSETRON HCL 8 MG PO TABS: 8.0000 mg | ORAL_TABLET | Freq: Three times a day (TID) | ORAL | 2 refills | Status: DC | PRN

## 2016-12-15 MED ORDER — PROCHLORPERAZINE MALEATE 10 MG PO TABS: 10.0000 mg | ORAL_TABLET | Freq: Four times a day (QID) | ORAL | 2 refills | Status: DC | PRN

## 2016-12-15 NOTE — Progress Notes (Signed)
START ON PATHWAY REGIMEN - Non-Small Cell Lung     A cycle is every 21 days:     Pembrolizumab      Pemetrexed      Carboplatin   **Always confirm dose/schedule in your pharmacy ordering system**    Patient Characteristics: Stage IV Metastatic, Non Squamous, Initial Chemotherapy/Immunotherapy, PS = 0, 1, PD-L1 Expression Positive 1-49% (TPS) / Negative / Not Tested / Awaiting Test Results AJCC T Category: TX Current Disease Status: Distant Metastases AJCC N Category: N3 AJCC M Category: M1c AJCC 8 Stage Grouping: IVB Histology: Non Squamous Cell ROS1 Rearrangement Status: Awaiting Test Results T790M Mutation Status: Not Applicable - EGFR Mutation Negative/Unknown Other Mutations/Biomarkers: No Other Actionable Mutations PD-L1 Expression Status: Awaiting Test Results Chemotherapy/Immunotherapy LOT: Initial Chemotherapy/Immunotherapy Molecular Targeted Therapy: Not Appropriate ALK Translocation Status: Awaiting Test Results Would you be surprised if this patient died  in the next year? I would NOT be surprised if this patient died in the next year EGFR Mutation Status: Awaiting Test Results BRAF [id number] Mutation Status: Awaiting Test Results Performance Status: PS = 0, 1 Intent of Therapy: Non-Curative / Palliative Intent, Discussed with Patient

## 2016-12-17 ENCOUNTER — Encounter: Payer: Self-pay | Admitting: Radiation Oncology

## 2016-12-17 ENCOUNTER — Inpatient Hospital Stay: Payer: Medicare Other

## 2016-12-17 ENCOUNTER — Ambulatory Visit: Payer: Medicare Other

## 2016-12-17 ENCOUNTER — Other Ambulatory Visit: Payer: Self-pay

## 2016-12-17 ENCOUNTER — Inpatient Hospital Stay
Admission: AD | Admit: 2016-12-17 | Discharge: 2017-01-19 | DRG: 391 | Disposition: E | Payer: Medicare Other | Source: Ambulatory Visit | Attending: Internal Medicine | Admitting: Internal Medicine

## 2016-12-17 ENCOUNTER — Encounter: Payer: Self-pay | Admitting: Oncology

## 2016-12-17 ENCOUNTER — Telehealth: Payer: Self-pay | Admitting: *Deleted

## 2016-12-17 ENCOUNTER — Encounter: Payer: Self-pay | Admitting: *Deleted

## 2016-12-17 ENCOUNTER — Ambulatory Visit
Admission: RE | Admit: 2016-12-17 | Discharge: 2016-12-17 | Disposition: A | Discharge status: Home / Self Care | Payer: Medicare Other | Source: Ambulatory Visit | Attending: Radiation Oncology | Admitting: Radiation Oncology

## 2016-12-17 ENCOUNTER — Inpatient Hospital Stay (HOSPITAL_BASED_OUTPATIENT_CLINIC_OR_DEPARTMENT_OTHER): Payer: Medicare Other | Admitting: Oncology

## 2016-12-17 VITALS — BP 153/81 | HR 94 | Temp 97.8°F | Resp 22 | Wt 172.1 lb

## 2016-12-17 VITALS — BP 156/84 | HR 92 | Temp 97.0°F | Wt 172.0 lb

## 2016-12-17 DIAGNOSIS — R5383 Other fatigue: Secondary | ICD-10-CM | POA: Insufficient documentation

## 2016-12-17 DIAGNOSIS — E43 Unspecified severe protein-calorie malnutrition: Secondary | ICD-10-CM | POA: Diagnosis present

## 2016-12-17 DIAGNOSIS — R634 Abnormal weight loss: Secondary | ICD-10-CM | POA: Diagnosis not present

## 2016-12-17 DIAGNOSIS — Z87891 Personal history of nicotine dependence: Secondary | ICD-10-CM

## 2016-12-17 DIAGNOSIS — C786 Secondary malignant neoplasm of retroperitoneum and peritoneum: Secondary | ICD-10-CM | POA: Diagnosis present

## 2016-12-17 DIAGNOSIS — R1312 Dysphagia, oropharyngeal phase: Secondary | ICD-10-CM | POA: Diagnosis present

## 2016-12-17 DIAGNOSIS — R638 Other symptoms and signs concerning food and fluid intake: Secondary | ICD-10-CM | POA: Diagnosis not present

## 2016-12-17 DIAGNOSIS — N179 Acute kidney failure, unspecified: Secondary | ICD-10-CM | POA: Diagnosis present

## 2016-12-17 DIAGNOSIS — I517 Cardiomegaly: Secondary | ICD-10-CM

## 2016-12-17 DIAGNOSIS — C7951 Secondary malignant neoplasm of bone: Secondary | ICD-10-CM | POA: Diagnosis present

## 2016-12-17 DIAGNOSIS — C7970 Secondary malignant neoplasm of unspecified adrenal gland: Secondary | ICD-10-CM

## 2016-12-17 DIAGNOSIS — E119 Type 2 diabetes mellitus without complications: Secondary | ICD-10-CM

## 2016-12-17 DIAGNOSIS — R5381 Other malaise: Secondary | ICD-10-CM

## 2016-12-17 DIAGNOSIS — D509 Iron deficiency anemia, unspecified: Secondary | ICD-10-CM | POA: Insufficient documentation

## 2016-12-17 DIAGNOSIS — I10 Essential (primary) hypertension: Secondary | ICD-10-CM | POA: Diagnosis present

## 2016-12-17 DIAGNOSIS — Z7982 Long term (current) use of aspirin: Secondary | ICD-10-CM | POA: Insufficient documentation

## 2016-12-17 DIAGNOSIS — R1310 Dysphagia, unspecified: Secondary | ICD-10-CM

## 2016-12-17 DIAGNOSIS — Z79891 Long term (current) use of opiate analgesic: Secondary | ICD-10-CM

## 2016-12-17 DIAGNOSIS — C7989 Secondary malignant neoplasm of other specified sites: Secondary | ICD-10-CM | POA: Diagnosis present

## 2016-12-17 DIAGNOSIS — R110 Nausea: Secondary | ICD-10-CM

## 2016-12-17 DIAGNOSIS — C7972 Secondary malignant neoplasm of left adrenal gland: Secondary | ICD-10-CM | POA: Diagnosis present

## 2016-12-17 DIAGNOSIS — C7971 Secondary malignant neoplasm of right adrenal gland: Secondary | ICD-10-CM | POA: Diagnosis present

## 2016-12-17 DIAGNOSIS — C3490 Malignant neoplasm of unspecified part of unspecified bronchus or lung: Secondary | ICD-10-CM

## 2016-12-17 DIAGNOSIS — R109 Unspecified abdominal pain: Secondary | ICD-10-CM

## 2016-12-17 DIAGNOSIS — Z79818 Long term (current) use of other agents affecting estrogen receptors and estrogen levels: Secondary | ICD-10-CM

## 2016-12-17 DIAGNOSIS — C7931 Secondary malignant neoplasm of brain: Secondary | ICD-10-CM | POA: Diagnosis present

## 2016-12-17 DIAGNOSIS — Z515 Encounter for palliative care: Secondary | ICD-10-CM | POA: Diagnosis not present

## 2016-12-17 DIAGNOSIS — C787 Secondary malignant neoplasm of liver and intrahepatic bile duct: Secondary | ICD-10-CM | POA: Diagnosis present

## 2016-12-17 DIAGNOSIS — Z7189 Other specified counseling: Secondary | ICD-10-CM | POA: Diagnosis not present

## 2016-12-17 DIAGNOSIS — I313 Pericardial effusion (noninflammatory): Secondary | ICD-10-CM | POA: Insufficient documentation

## 2016-12-17 DIAGNOSIS — E638 Other specified nutritional deficiencies: Secondary | ICD-10-CM | POA: Diagnosis not present

## 2016-12-17 DIAGNOSIS — R531 Weakness: Secondary | ICD-10-CM | POA: Insufficient documentation

## 2016-12-17 DIAGNOSIS — G629 Polyneuropathy, unspecified: Secondary | ICD-10-CM | POA: Insufficient documentation

## 2016-12-17 DIAGNOSIS — E860 Dehydration: Secondary | ICD-10-CM

## 2016-12-17 DIAGNOSIS — C781 Secondary malignant neoplasm of mediastinum: Secondary | ICD-10-CM | POA: Diagnosis present

## 2016-12-17 DIAGNOSIS — I700 Atherosclerosis of aorta: Secondary | ICD-10-CM | POA: Insufficient documentation

## 2016-12-17 DIAGNOSIS — I119 Hypertensive heart disease without heart failure: Secondary | ICD-10-CM

## 2016-12-17 DIAGNOSIS — Z66 Do not resuscitate: Secondary | ICD-10-CM | POA: Diagnosis not present

## 2016-12-17 DIAGNOSIS — Z79899 Other long term (current) drug therapy: Secondary | ICD-10-CM

## 2016-12-17 DIAGNOSIS — R590 Localized enlarged lymph nodes: Secondary | ICD-10-CM | POA: Insufficient documentation

## 2016-12-17 DIAGNOSIS — C770 Secondary and unspecified malignant neoplasm of lymph nodes of head, face and neck: Secondary | ICD-10-CM | POA: Diagnosis not present

## 2016-12-17 DIAGNOSIS — R627 Adult failure to thrive: Secondary | ICD-10-CM

## 2016-12-17 DIAGNOSIS — Z6829 Body mass index (BMI) 29.0-29.9, adult: Secondary | ICD-10-CM

## 2016-12-17 DIAGNOSIS — R05 Cough: Secondary | ICD-10-CM | POA: Insufficient documentation

## 2016-12-17 DIAGNOSIS — J439 Emphysema, unspecified: Secondary | ICD-10-CM | POA: Diagnosis not present

## 2016-12-17 DIAGNOSIS — Z7952 Long term (current) use of systemic steroids: Secondary | ICD-10-CM

## 2016-12-17 DIAGNOSIS — E1140 Type 2 diabetes mellitus with diabetic neuropathy, unspecified: Secondary | ICD-10-CM | POA: Diagnosis present

## 2016-12-17 DIAGNOSIS — R1033 Periumbilical pain: Secondary | ICD-10-CM

## 2016-12-17 DIAGNOSIS — R1084 Generalized abdominal pain: Secondary | ICD-10-CM | POA: Diagnosis not present

## 2016-12-17 DIAGNOSIS — K449 Diaphragmatic hernia without obstruction or gangrene: Secondary | ICD-10-CM | POA: Diagnosis present

## 2016-12-17 DIAGNOSIS — R490 Dysphonia: Secondary | ICD-10-CM | POA: Diagnosis not present

## 2016-12-17 DIAGNOSIS — R0602 Shortness of breath: Secondary | ICD-10-CM | POA: Diagnosis not present

## 2016-12-17 DIAGNOSIS — Z923 Personal history of irradiation: Secondary | ICD-10-CM | POA: Insufficient documentation

## 2016-12-17 LAB — COMPREHENSIVE METABOLIC PANEL
ALK PHOS: 59 U/L (ref 38–126)
ALT: 14 U/L — AB (ref 17–63)
AST: 20 U/L (ref 15–41)
Albumin: 3.2 g/dL — ABNORMAL LOW (ref 3.5–5.0)
Anion gap: 11 (ref 5–15)
BILIRUBIN TOTAL: 0.6 mg/dL (ref 0.3–1.2)
BUN: 34 mg/dL — AB (ref 6–20)
CO2: 22 mmol/L (ref 22–32)
CREATININE: 1.4 mg/dL — AB (ref 0.61–1.24)
Calcium: 8.7 mg/dL — ABNORMAL LOW (ref 8.9–10.3)
Chloride: 108 mmol/L (ref 101–111)
GFR calc Af Amer: 53 mL/min — ABNORMAL LOW (ref >60)
GFR, EST NON AFRICAN AMERICAN: 46 mL/min — AB (ref >60)
Glucose, Bld: 167 mg/dL — ABNORMAL HIGH (ref 65–99)
Potassium: 3.6 mmol/L (ref 3.5–5.1)
Sodium: 141 mmol/L (ref 135–145)
TOTAL PROTEIN: 6.9 g/dL (ref 6.5–8.1)

## 2016-12-17 LAB — CBC WITH DIFFERENTIAL/PLATELET
Basophils Absolute: 0.1 10*3/uL (ref 0–0.1)
Basophils Relative: 1 %
EOS ABS: 0.2 10*3/uL (ref 0–0.7)
EOS PCT: 2 %
HCT: 26.7 % — ABNORMAL LOW (ref 40.0–52.0)
Hemoglobin: 8.9 g/dL — ABNORMAL LOW (ref 13.0–18.0)
LYMPHS ABS: 0.8 10*3/uL — AB (ref 1.0–3.6)
Lymphocytes Relative: 7 %
MCH: 29.1 pg (ref 26.0–34.0)
MCHC: 33.4 g/dL (ref 32.0–36.0)
MCV: 87.2 fL (ref 80.0–100.0)
MONO ABS: 0.3 10*3/uL (ref 0.2–1.0)
Monocytes Relative: 2 %
Neutro Abs: 10.2 10*3/uL — ABNORMAL HIGH (ref 1.4–6.5)
Neutrophils Relative %: 88 %
Platelets: 304 10*3/uL (ref 150–440)
RBC: 3.06 MIL/uL — ABNORMAL LOW (ref 4.40–5.90)
RDW: 13.9 % (ref 11.5–14.5)
WBC: 11.4 10*3/uL — AB (ref 3.8–10.6)

## 2016-12-17 LAB — GLUCOSE, CAPILLARY
GLUCOSE-CAPILLARY: 146 mg/dL — AB (ref 65–99)
GLUCOSE-CAPILLARY: 123 mg/dL — AB (ref 65–99)

## 2016-12-17 LAB — MAGNESIUM: Magnesium: 2.7 mg/dL — ABNORMAL HIGH (ref 1.7–2.4)

## 2016-12-17 LAB — PHOSPHORUS: Phosphorus: 4.6 mg/dL (ref 2.5–4.6)

## 2016-12-17 MED ORDER — ALBUTEROL SULFATE (2.5 MG/3ML) 0.083% IN NEBU
2.5000 mg | INHALATION_SOLUTION | RESPIRATORY_TRACT | Status: DC | PRN
  Administered 2016-12-18: 2.5 mg via RESPIRATORY_TRACT
  Filled 2016-12-17: qty 3

## 2016-12-17 MED ORDER — POTASSIUM CHLORIDE IN NACL 20-0.9 MEQ/L-% IV SOLN
INTRAVENOUS | Status: DC
  Administered 2016-12-17 – 2016-12-20 (×6): via INTRAVENOUS
  Filled 2016-12-17 (×8): qty 1000

## 2016-12-17 MED ORDER — PROCHLORPERAZINE MALEATE 10 MG PO TABS
10.0000 mg | ORAL_TABLET | Freq: Four times a day (QID) | ORAL | Status: DC | PRN
Start: 2016-12-17 — End: 2016-12-18
  Filled 2016-12-17: qty 1

## 2016-12-17 MED ORDER — FOLIC ACID 1 MG PO TABS
1.0000 mg | ORAL_TABLET | Freq: Every day | ORAL | Status: DC
  Administered 2016-12-17: 1 mg via ORAL
  Filled 2016-12-17 (×2): qty 1

## 2016-12-17 MED ORDER — INSULIN ASPART 100 UNIT/ML ~~LOC~~ SOLN: 0.0000 [IU] | Freq: Every day | SUBCUTANEOUS | Status: DC

## 2016-12-17 MED ORDER — PROCHLORPERAZINE MALEATE 10 MG PO TABS: 10.0000 mg | ORAL_TABLET | Freq: Four times a day (QID) | ORAL | 2 refills | Status: AC | PRN

## 2016-12-17 MED ORDER — MEGESTROL ACETATE 40 MG PO TABS
40.0000 mg | ORAL_TABLET | Freq: Every day | ORAL | Status: DC
  Filled 2016-12-17 (×3): qty 1

## 2016-12-17 MED ORDER — HYDROCODONE-ACETAMINOPHEN 5-325 MG PO TABS: 1.0000 | ORAL_TABLET | Freq: Four times a day (QID) | ORAL | 0 refills | Status: AC | PRN

## 2016-12-17 MED ORDER — ACETAMINOPHEN 650 MG RE SUPP: 650.0000 mg | Freq: Four times a day (QID) | RECTAL | Status: DC | PRN

## 2016-12-17 MED ORDER — DOCUSATE SODIUM 100 MG PO CAPS
100.0000 mg | ORAL_CAPSULE | Freq: Two times a day (BID) | ORAL | Status: DC
  Administered 2016-12-17 (×2): 100 mg via ORAL
  Filled 2016-12-17 (×3): qty 1

## 2016-12-17 MED ORDER — HYDROCODONE-ACETAMINOPHEN 5-325 MG PO TABS: 1.0000 | ORAL_TABLET | Freq: Four times a day (QID) | ORAL | 0 refills | Status: DC | PRN

## 2016-12-17 MED ORDER — DEXAMETHASONE 4 MG PO TABS
4.0000 mg | ORAL_TABLET | Freq: Every day | ORAL | Status: DC
  Filled 2016-12-17: qty 1

## 2016-12-17 MED ORDER — INSULIN ASPART 100 UNIT/ML ~~LOC~~ SOLN
0.0000 [IU] | Freq: Three times a day (TID) | SUBCUTANEOUS | Status: DC
  Administered 2016-12-17 – 2016-12-18 (×2): 1 [IU] via SUBCUTANEOUS
  Administered 2016-12-19: 18:00:00 2 [IU] via SUBCUTANEOUS
  Administered 2016-12-19 – 2016-12-20 (×3): 1 [IU] via SUBCUTANEOUS
  Filled 2016-12-17 (×6): qty 1

## 2016-12-17 MED ORDER — FLEET ENEMA 7-19 GM/118ML RE ENEM: 1.0000 | ENEMA | Freq: Once | RECTAL | Status: DC | PRN

## 2016-12-17 MED ORDER — FENTANYL 25 MCG/HR TD PT72: 25.0000 ug | MEDICATED_PATCH | TRANSDERMAL | 0 refills | Status: AC

## 2016-12-17 MED ORDER — BISACODYL 5 MG PO TBEC: 5.0000 mg | DELAYED_RELEASE_TABLET | Freq: Every day | ORAL | Status: DC | PRN

## 2016-12-17 MED ORDER — ONDANSETRON HCL 8 MG PO TABS: 8.0000 mg | ORAL_TABLET | Freq: Three times a day (TID) | ORAL | 2 refills | Status: AC | PRN

## 2016-12-17 MED ORDER — KETOROLAC TROMETHAMINE 15 MG/ML IJ SOLN
15.0000 mg | Freq: Four times a day (QID) | INTRAMUSCULAR | Status: DC | PRN
  Administered 2016-12-18 (×3): 15 mg via INTRAVENOUS
  Filled 2016-12-17 (×4): qty 1

## 2016-12-17 MED ORDER — FENTANYL 25 MCG/HR TD PT72
25.0000 ug | MEDICATED_PATCH | TRANSDERMAL | 0 refills | Status: DC
Start: 2016-12-17 — End: 2016-12-17

## 2016-12-17 MED ORDER — FENTANYL 25 MCG/HR TD PT72
25.0000 ug | MEDICATED_PATCH | TRANSDERMAL | Status: DC
  Administered 2016-12-17 – 2016-12-20 (×2): 25 ug via TRANSDERMAL
  Filled 2016-12-17 (×2): qty 1

## 2016-12-17 MED ORDER — HEPARIN SODIUM (PORCINE) 5000 UNIT/ML IJ SOLN
5000.0000 [IU] | Freq: Three times a day (TID) | INTRAMUSCULAR | Status: DC
  Administered 2016-12-17 – 2016-12-20 (×10): 5000 [IU] via SUBCUTANEOUS
  Filled 2016-12-17 (×10): qty 1

## 2016-12-17 MED ORDER — ACETAMINOPHEN 325 MG PO TABS: 650.0000 mg | ORAL_TABLET | Freq: Four times a day (QID) | ORAL | Status: DC | PRN

## 2016-12-17 MED ORDER — ONDANSETRON HCL 4 MG/2ML IJ SOLN
4.0000 mg | Freq: Four times a day (QID) | INTRAMUSCULAR | Status: DC | PRN
  Administered 2016-12-18 – 2016-12-19 (×3): 4 mg via INTRAVENOUS
  Filled 2016-12-17 (×3): qty 2

## 2016-12-17 MED ORDER — PANTOPRAZOLE SODIUM 40 MG PO TBEC
40.0000 mg | DELAYED_RELEASE_TABLET | Freq: Every day | ORAL | Status: DC
  Filled 2016-12-17: qty 1

## 2016-12-17 MED ORDER — MEGESTROL ACETATE 40 MG PO TABS: 40.0000 mg | ORAL_TABLET | Freq: Every day | ORAL | 1 refills | Status: AC

## 2016-12-17 MED ORDER — HYDROCODONE-ACETAMINOPHEN 5-325 MG PO TABS
1.0000 | ORAL_TABLET | ORAL | Status: DC | PRN
  Administered 2016-12-17: 16:00:00 2 via ORAL
  Filled 2016-12-17: qty 2

## 2016-12-17 MED ORDER — ONDANSETRON HCL 4 MG PO TABS: 4.0000 mg | ORAL_TABLET | Freq: Four times a day (QID) | ORAL | Status: DC | PRN

## 2016-12-17 MED ORDER — SENNOSIDES-DOCUSATE SODIUM 8.6-50 MG PO TABS: 1.0000 | ORAL_TABLET | Freq: Every evening | ORAL | Status: DC | PRN

## 2016-12-17 MED ORDER — ASPIRIN EC 81 MG PO TBEC
81.0000 mg | DELAYED_RELEASE_TABLET | Freq: Every day | ORAL | Status: DC
  Filled 2016-12-17: qty 1

## 2016-12-17 NOTE — Telephone Encounter (Signed)
Received call from pt's son, Timmothy Sours. Timmothy Sours stated that patient has been in pain over the weekend and unable to eat or drink anything due to feeling bloated. Informed pt's son that we can add pt on to be seen by Sonia Baller, NP after his consultation with Dr. Baruch Gouty. Pt's son verbalized understanding.

## 2016-12-17 NOTE — Patient Instructions (Signed)
Pembrolizumab injection What is this medicine? PEMBROLIZUMAB (pem broe liz ue mab) is a monoclonal antibody. It is used to treat melanoma, head and neck cancer, Hodgkin lymphoma, non-small cell lung cancer, urothelial cancer, stomach cancer, and cancers that have a certain genetic condition. This medicine may be used for other purposes; ask your health care provider or pharmacist if you have questions. COMMON BRAND NAME(S): Keytruda What should I tell my health care provider before I take this medicine? They need to know if you have any of these conditions: -diabetes -immune system problems -inflammatory bowel disease -liver disease -lung or breathing disease -lupus -organ transplant -an unusual or allergic reaction to pembrolizumab, other medicines, foods, dyes, or preservatives -pregnant or trying to get pregnant -breast-feeding How should I use this medicine? This medicine is for infusion into a vein. It is given by a health care professional in a hospital or clinic setting. A special MedGuide will be given to you before each treatment. Be sure to read this information carefully each time. Talk to your pediatrician regarding the use of this medicine in children. While this drug may be prescribed for selected conditions, precautions do apply. Overdosage: If you think you have taken too much of this medicine contact a poison control center or emergency room at once. NOTE: This medicine is only for you. Do not share this medicine with others. What if I miss a dose? It is important not to miss your dose. Call your doctor or health care professional if you are unable to keep an appointment. What may interact with this medicine? Interactions have not been studied. Give your health care provider a list of all the medicines, herbs, non-prescription drugs, or dietary supplements you use. Also tell them if you smoke, drink alcohol, or use illegal drugs. Some items may interact with your  medicine. This list may not describe all possible interactions. Give your health care provider a list of all the medicines, herbs, non-prescription drugs, or dietary supplements you use. Also tell them if you smoke, drink alcohol, or use illegal drugs. Some items may interact with your medicine. What should I watch for while using this medicine? Your condition will be monitored carefully while you are receiving this medicine. You may need blood work done while you are taking this medicine. Do not become pregnant while taking this medicine or for 4 months after stopping it. Women should inform their doctor if they wish to become pregnant or think they might be pregnant. There is a potential for serious side effects to an unborn child. Talk to your health care professional or pharmacist for more information. Do not breast-feed an infant while taking this medicine or for 4 months after the last dose. What side effects may I notice from receiving this medicine? Side effects that you should report to your doctor or health care professional as soon as possible: -allergic reactions like skin rash, itching or hives, swelling of the face, lips, or tongue -bloody or black, tarry -breathing problems -changes in vision -chest pain -chills -constipation -cough -dizziness or feeling faint or lightheaded -fast or irregular heartbeat -fever -flushing -hair loss -low blood counts - this medicine may decrease the number of white blood cells, red blood cells and platelets. You may be at increased risk for infections and bleeding. -muscle pain -muscle weakness -persistent headache -signs and symptoms of high blood sugar such as dizziness; dry mouth; dry skin; fruity breath; nausea; stomach pain; increased hunger or thirst; increased urination -signs and symptoms of kidney  injury like trouble passing urine or change in the amount of urine -signs and symptoms of liver injury like dark urine, light-colored  stools, loss of appetite, nausea, right upper belly pain, yellowing of the eyes or skin -stomach pain -sweating -weight loss Side effects that usually do not require medical attention (report to your doctor or health care professional if they continue or are bothersome): -decreased appetite -diarrhea -tiredness This list may not describe all possible side effects. Call your doctor for medical advice about side effects. You may report side effects to FDA at 1-800-FDA-1088. Where should I keep my medicine? This drug is given in a hospital or clinic and will not be stored at home. NOTE: This sheet is a summary. It may not cover all possible information. If you have questions about this medicine, talk to your doctor, pharmacist, or health care provider.  2018 Elsevier/Gold Standard (2016-02-14 12:29:36) Pemetrexed injection What is this medicine? PEMETREXED (PEM e TREX ed) is a chemotherapy drug used to treat lung cancers like non-small cell lung cancer and mesothelioma. It may also be used to treat other cancers. This medicine may be used for other purposes; ask your health care provider or pharmacist if you have questions. COMMON BRAND NAME(S): Alimta What should I tell my health care provider before I take this medicine? They need to know if you have any of these conditions: -infection (especially a virus infection such as chickenpox, cold sores, or herpes) -kidney disease -low blood counts, like low white cell, platelet, or red cell counts -lung or breathing disease, like asthma -radiation therapy -an unusual or allergic reaction to pemetrexed, other medicines, foods, dyes, or preservative -pregnant or trying to get pregnant -breast-feeding How should I use this medicine? This drug is given as an infusion into a vein. It is administered in a hospital or clinic by a specially trained health care professional. Talk to your pediatrician regarding the use of this medicine in children.  Special care may be needed. Overdosage: If you think you have taken too much of this medicine contact a poison control center or emergency room at once. NOTE: This medicine is only for you. Do not share this medicine with others. What if I miss a dose? It is important not to miss your dose. Call your doctor or health care professional if you are unable to keep an appointment. What may interact with this medicine? This medicine may interact with the following medications: -Ibuprofen This list may not describe all possible interactions. Give your health care provider a list of all the medicines, herbs, non-prescription drugs, or dietary supplements you use. Also tell them if you smoke, drink alcohol, or use illegal drugs. Some items may interact with your medicine. What should I watch for while using this medicine? Visit your doctor for checks on your progress. This drug may make you feel generally unwell. This is not uncommon, as chemotherapy can affect healthy cells as well as cancer cells. Report any side effects. Continue your course of treatment even though you feel ill unless your doctor tells you to stop. In some cases, you may be given additional medicines to help with side effects. Follow all directions for their use. Call your doctor or health care professional for advice if you get a fever, chills or sore throat, or other symptoms of a cold or flu. Do not treat yourself. This drug decreases your body's ability to fight infections. Try to avoid being around people who are sick. This medicine may increase your  risk to bruise or bleed. Call your doctor or health care professional if you notice any unusual bleeding. Be careful brushing and flossing your teeth or using a toothpick because you may get an infection or bleed more easily. If you have any dental work done, tell your dentist you are receiving this medicine. Avoid taking products that contain aspirin, acetaminophen, ibuprofen, naproxen,  or ketoprofen unless instructed by your doctor. These medicines may hide a fever. Call your doctor or health care professional if you get diarrhea or mouth sores. Do not treat yourself. To protect your kidneys, drink water or other fluids as directed while you are taking this medicine. Do not become pregnant while taking this medicine or for 6 months after stopping it. Women should inform their doctor if they wish to become pregnant or think they might be pregnant. Men should not father a child while taking this medicine and for 3 months after stopping it. This may interfere with the ability to father a child. You should talk to your doctor or health care professional if you are concerned about your fertility. There is a potential for serious side effects to an unborn child. Talk to your health care professional or pharmacist for more information. Do not breast-feed an infant while taking this medicine or for 1 week after stopping it. What side effects may I notice from receiving this medicine? Side effects that you should report to your doctor or health care professional as soon as possible: -allergic reactions like skin rash, itching or hives, swelling of the face, lips, or tongue -breathing problems -redness, blistering, peeling or loosening of the skin, including inside the mouth -signs and symptoms of bleeding such as bloody or black, tarry stools; red or dark-brown urine; spitting up blood or brown material that looks like coffee grounds; red spots on the skin; unusual bruising or bleeding from the eye, gums, or nose -signs and symptoms of infection like fever or chills; cough; sore throat; pain or trouble passing urine -signs and symptoms of kidney injury like trouble passing urine or change in the amount of urine -signs and symptoms of liver injury like dark yellow or brown urine; general ill feeling or flu-like symptoms; light-colored stools; loss of appetite; nausea; right upper belly pain;  unusually weak or tired; yellowing of the eyes or skin Side effects that usually do not require medical attention (report to your doctor or health care professional if they continue or are bothersome): -constipation -dizziness -mouth sores -nausea, vomiting -pain, tingling, numbness in the hands or feet -unusually weak or tired This list may not describe all possible side effects. Call your doctor for medical advice about side effects. You may report side effects to FDA at 1-800-FDA-1088. Where should I keep my medicine? This drug is given in a hospital or clinic and will not be stored at home. NOTE: This sheet is a summary. It may not cover all possible information. If you have questions about this medicine, talk to your doctor, pharmacist, or health care provider.  2018 Elsevier/Gold Standard (2016-03-06 18:51:46)

## 2016-12-17 NOTE — Progress Notes (Signed)
  Oncology Nurse Navigator Documentation  Navigator Location: [facility] (11/27/2016 1300)   )Navigator Encounter Type: Initial RadOnc (11/23/2016 1300)                         Barriers/Navigation Needs: Coordination of Care (12/13/2016 1300)   Interventions: Coordination of Care (12/13/2016 1300)   Coordination of Care: Appts (11/29/2016 1300)     met with patient during initial radiation oncology consult. Pt presented with several complaints that developed over the weekend. All questions answered at time of visit regarding radiation treatments. Due to multiple symptoms, pt added to be seen in symptom management clinic. Assisted pt through clinic and inpatient admission. Family instructed to keep appts for chemo class and to start chemo on Friday. Pt may be able to do CT Sim while in hospital but will send message to rad-onc nurses to coordinate. Pt and family verbalized understanding. Instructed to call with any further questions.             Time Spent with Patient: 60 (11/27/2016 1300)

## 2016-12-17 NOTE — Progress Notes (Signed)
[place]  [phone] [phone]  ID: Keith Chandler OB: 1934-06-17  MR#: [id number]  CMK#:[id number]  Patient Care Team: Center, [facility] as PCP - General (General Practice)  CHIEF COMPLAINT: Stage IVB adenocarcinoma of the lung  INTERVAL HISTORY: Patient returns to clinic today due to severe periumbilical abdominal pain, dysphagia and dehydration. He is scheduled to begin chemo on Friday but has been progressively getting weak and has been unable to swallow anything, even water. Eating makes him nauseated and causes throat pain. He continues to have a chronic cough and shortness of breath. He has severe nausea without vomiting constantly. He has not had a bowel movement in over a week. He denies chest pain. He denies urinary complaints.   REVIEW OF SYSTEMS:   Review of Systems  Constitutional: Positive for malaise/fatigue and weight loss. Negative for fever.  HENT: Negative.   Eyes: Negative.   Respiratory: Positive for cough, shortness of breath and wheezing. Negative for hemoptysis.   Cardiovascular: Negative.  Negative for chest pain and leg swelling.  Gastrointestinal: Positive for abdominal pain, constipation and nausea. Negative for diarrhea and vomiting.       Periumbilical  Genitourinary: Negative.   Musculoskeletal: Negative.   Skin: Negative.  Negative for rash.  Neurological: Positive for weakness.  Endo/Heme/Allergies: Negative.   Psychiatric/Behavioral: The patient is nervous/anxious.     As per HPI. Otherwise, a complete review of systems is negative.  PAST MEDICAL HISTORY: Past Medical History:  Diagnosis Date  . Diabetes mellitus without complication ([place])   . Hypertension   . Neuropathy     PAST SURGICAL HISTORY: Past Surgical History:  Procedure Laterality Date  . CATARACT EXTRACTION, BILATERAL      FAMILY HISTORY: Family History  Problem Relation Age of Onset  . Family history unknown: Yes      ADVANCED DIRECTIVES (Y/N):  N  HEALTH MAINTENANCE: Social History  Substance Use Topics  . Smoking status: Former Smoker    Packs/day: 2.00    Years: 45.00    Types: Cigarettes  . Smokeless tobacco: Former Systems developer    Quit date: 05/21/1992  . Alcohol use No     Colonoscopy:  PAP:  Bone density:  Lipid panel:  Allergies  Allergen Reactions  . Sulfa Antibiotics     Current Outpatient Prescriptions  Medication Sig Dispense Refill  . aspirin EC 81 MG tablet Take 81 mg by mouth daily.    Marland Kitchen atorvastatin (LIPITOR) 10 MG tablet Take 10 mg by mouth daily.    Marland Kitchen dexamethasone (DECADRON) 4 MG tablet Take 1 tablet (4 mg total) by mouth daily. 30 tablet 0  . folic acid (FOLVITE) 1 MG tablet Take 1 tablet (1 mg total) by mouth daily. 90 tablet 3  . gabapentin (NEURONTIN) 600 MG tablet Take 600 mg by mouth 3 (three) times daily as needed.    . lidocaine-prilocaine (EMLA) cream Apply to affected area once 30 g 3  . megestrol (MEGACE) 40 MG tablet Take 1 tablet (40 mg total) by mouth daily. 30 tablet 1  . omeprazole (PRILOSEC) 20 MG capsule Take 1 capsule (20 mg total) by mouth 2 (two) times daily before a meal. 60 capsule 1  . ondansetron (ZOFRAN) 8 MG tablet Take 1 tablet (8 mg total) by mouth every 8 (eight) hours as needed. 60 tablet 2  . prochlorperazine (COMPAZINE) 10 MG tablet Take 1 tablet (10 mg total) by mouth every 6 (six) hours as needed (Nausea or vomiting). [place]  tablet 2  . fentaNYL (DURAGESIC - DOSED MCG/HR) 25 MCG/HR patch Place 1 patch (25 mcg total) onto the skin every 3 (three) days. 10 patch 0  . HYDROcodone-acetaminophen (NORCO) 5-325 MG tablet Take 1 tablet by mouth every 6 (six) hours as needed for moderate pain. 30 tablet 0   No current facility-administered medications for this visit.     OBJECTIVE: Vitals:   11/29/2016 1146  BP: (!) 156/84  Pulse: 92  Temp: (!) 97 F (36.1 C)     Body mass index is 27.76 kg/m.    ECOG FS:1 - Symptomatic but completely  ambulatory  General: Frail older adult.  Eyes: Pink conjunctiva, anicteric sclera. HEENT: Normocephalic, dry mucous membranes, clear oropharnyx. Lungs: Wheezes and rhonchi heard bilaterally.  Heart: Regular rate and rhythm. No rubs, murmurs, or gallops. Abdomen: Very tender periumbilical region. No mass palpated. No organomegaly noted, hypoactive bowel sounds. Musculoskeletal: No edema, cyanosis, or clubbing. Neuro: Alert. Unable to answers due to sore throat.  Skin: No rashes or petechiae noted. Psych: Normal affect. Lymphatics: No cervical, calvicular, axillary or inguinal LAD.   LAB RESULTS:  Lab Results  Component Value Date   NA 141 12/12/2016   K 3.6 12/01/2016   CL 108 11/20/2016   CO2 22 11/29/2016   GLUCOSE 167 (H) 11/29/2016   BUN 34 (H) 12/13/2016   CREATININE 1.40 (H) 12/13/2016   CALCIUM 8.7 (L) 11/22/2016   PROT 6.9 11/25/2016   ALBUMIN 3.2 (L) 11/19/2016   AST 20 12/11/2016   ALT 14 (L) 11/22/2016   ALKPHOS 59 11/18/2016   BILITOT 0.6 12/04/2016   GFRNONAA 46 (L) 12/07/2016   GFRAA 53 (L) 12/09/2016    Lab Results  Component Value Date   WBC 11.4 (H) 11/30/2016   NEUTROABS 10.2 (H) 12/18/2016   HGB 8.9 (L) 12/16/2016   HCT 26.7 (L) 11/25/2016   MCV 87.2 11/22/2016   PLT 304 11/23/2016     STUDIES: Dg Chest 2 View  Result Date: 12/02/2016 CLINICAL DATA:  Fatigue, weakness, chest pressure and cough EXAM: CHEST  2 VIEW COMPARISON:  None. FINDINGS: Mild cardiomegaly with vascular congestion. Chronic nonspecific interstitial prominence without definite edema. No significant effusion or pneumothorax. No focal pneumonia, collapse or consolidation. Trachea is midline. Atherosclerosis of aorta. Degenerative changes of the spine. No compression fracture. IMPRESSION: Cardiomegaly with vascular congestion Mild chronic interstitial changes suspected. No definite superimposed CHF or pneumonia. Electronically Signed   By: Jerilynn Mages.  Shick M.D.   On: 12/02/2016 12:51   Ct  Chest W Contrast  Result Date: 12/02/2016 CLINICAL DATA:  Abnormal ultrasound showing liver masses, epigastric discomfort due to gas, lays, 15 pounds weight loss, loss of appetite, generalized weakness, type II diabetes mellitus, hypertension, former smoker EXAM: CT CHEST, ABDOMEN, AND PELVIS WITH CONTRAST TECHNIQUE: Multidetector CT imaging of the chest, abdomen and pelvis was performed following the standard protocol during bolus administration of intravenous contrast. Sagittal and coronal MPR images reconstructed from axial data set. CONTRAST:  53mL ISOVUE-300 IOPAMIDOL (ISOVUE-300) INJECTION 61% IV. Dilute oral contrast. COMPARISON:  Abdominal ultrasound 12/02/2016 FINDINGS: CT CHEST FINDINGS Cardiovascular: Atherosclerotic calcifications aorta, coronary arteries, proximal great vessels. Aorta normal caliber. Small pericardial effusion. Mild LEFT ventricular dilatation. Displacement and compression of the IVC by mediastinal adenopathy the remains patent. Mediastinum/Nodes: Moderate-sized hiatal hernia question mild wall thickening of the midesophagus. Multiple enlarged mediastinal lymph nodes including a conglomerate nodal mass at the AP window 3.3 x 4.6 cm, pretracheal node 16 mm image 21, prevascular node 16 mm  image 16, RIGHT superior mediastinal node 21 mm image 14, and necrotic subcarinal node 29 mm image 28. 17 mm RIGHT hilar node image 31. RIGHT superior hilar node 16 mm image 27. Adenopathy extends throughout the superior mediastinum on the RIGHT, displacing and compressing the IVC and extending to the base of the RIGHT cervical region. No axillary adenopathy. Unable to assess patency of the RIGHT subclavian vein. Lungs/Pleura: Small BILATERAL pleural effusions. Abnormal soft tissue/tumor surrounds the RIGHT and LEFT mainstem bronchi. Underlying emphysematous changes. Dependent atelectasis in the posterior lower lobes. No definite pulmonary mass. 4 mm RIGHT upper lobe nodule image 33. 3 mm RIGHT upper  lobe nodule image 63. Calcified granuloma anterior RIGHT lung. Few additional scattered tiny BILATERAL pulmonary nodules. 6 mm LEFT upper lobe nodule image 25. No infiltrate or pneumothorax. Musculoskeletal: No acute osseous lesions. CT ABDOMEN PELVIS FINDINGS Hepatobiliary: Subtle liver lesions question metastases including an 11 mm LEFT lobe lesion image 52, 9 mm RIGHT lobe lesion image 55, LEFT lobe lesion superiorly 8 mm image 50. Gallbladder unremarkable. Pancreas: Mild peripancreatic edema. Peripancreatic adenopathy. No discrete pancreatic mass or ductal dilatation. Spleen: Normal appearance Adrenals/Urinary Tract: BILATERAL adrenal nodules 17 x 12 mm LEFT and 9 x 6 mm RIGHT. LEFT renal cysts largest 4.5 x 3.4 cm image 72. Kidneys, ureters, and bladder otherwise normal appearance. Stomach/Bowel: Scattered stool throughout colon. Sigmoid diverticulosis without evidence of diverticulitis. No definite gastric or bowel mass. Normal appendix. Vascular/Lymphatic: Atherosclerotic calcifications aorta without aneurysm. Extensive adenopathy and mesenteric up to 15 mm short axis image 78. Peripancreatic, periportal and portal caval adenopathy up to 19 mm short axis image 62 and 16 mm image 56. Reproductive: Significant prostatic enlargement gland measuring 6.1 x 5.4 cm image 113. Other: Small amount of free fluid in pelvis. Significant edema in the mesenteric associated with mesenteric adenopathy. Multiple tumor nodules/implants in retroperitoneal fat in the flanks bilaterally. Possible peritoneal nodules anterior pelvis image 100 and LEFT pelvis image 94. BILATERAL inguinal hernias containing fat. No free air. Musculoskeletal: No acute osseous findings. Lytic metastatic lesions at L2, L3, L5, LEFT iliac. Probable metastatic lesion S2 segment of sacrum. IMPRESSION: Extensive tumor involvement in the chest, abdomen, and pelvis, with extensive adenopathy in the mediastinum and RIGHT hilum, mesenteric,  periportal/peripancreatic. Small BILATERAL pulmonary nodules question pulmonary metastases. Lytic metastatic lesions in lumbar spine and pelvis. Small hepatic and adrenal metastases as well as retroperitoneal tumor deposits bilaterally and question of peritoneal deposits in the pelvis. No definite primary tumor is localized. Small BILATERAL pleural effusions. Electronically Signed   By: Lavonia Dana M.D.   On: 12/02/2016 18:27   Mr Jeri Cos HQ Contrast  Result Date: 12/12/2016 CLINICAL DATA:  Initial staging of metastatic cancer. EXAM: MRI HEAD WITHOUT AND WITH CONTRAST TECHNIQUE: Multiplanar, multiecho pulse sequences of the brain and surrounding structures were obtained without and with intravenous contrast. CONTRAST:  34mL MULTIHANCE GADOBENATE DIMEGLUMINE 529 MG/ML IV SOLN COMPARISON:  None. FINDINGS: Brain: There is no evidence of acute infarct, intracranial hemorrhage, midline shift, or extra-axial fluid collection. There is moderate generalized cerebral atrophy. Scattered foci of cerebral white matter T2 hyperintensity are nonspecific but compatible with mild chronic small vessel ischemic disease. There is a 9 mm enhancing lesion with minimal surrounding edema in the right cerebellar hemisphere (series 12, image 36). A 6 mm enhancing lesion in the medial left occipital lobe also has minimal surrounding edema (series 12, image 72). There is a 3 mm enhancing lesion in the posterior left frontal operculum without edema (  series 12, image 88). Vascular: Major intracranial vascular flow voids are preserved. Skull and upper cervical spine: Approximately 2 cm lesion in the clivus, predominantly right of midline. Additional lesion involving C2 on the left. Sinuses/Orbits: Bilateral cataract extraction. No significant sinus disease. Other: None. IMPRESSION: 1. Three subcentimeter enhancing brain lesions consistent with metastases. Minimal edema. 2. Osseous metastases in the clivus and C2. 3. Mild chronic small vessel  ischemic disease and moderate cerebral atrophy. Electronically Signed   By: Logan Bores M.D.   On: 12/12/2016 11:34   Ct Abdomen Pelvis W Contrast  Result Date: 12/02/2016 CLINICAL DATA:  Abnormal ultrasound showing liver masses, epigastric discomfort due to gas, lays, 15 pounds weight loss, loss of appetite, generalized weakness, type II diabetes mellitus, hypertension, former smoker EXAM: CT CHEST, ABDOMEN, AND PELVIS WITH CONTRAST TECHNIQUE: Multidetector CT imaging of the chest, abdomen and pelvis was performed following the standard protocol during bolus administration of intravenous contrast. Sagittal and coronal MPR images reconstructed from axial data set. CONTRAST:  26mL ISOVUE-300 IOPAMIDOL (ISOVUE-300) INJECTION 61% IV. Dilute oral contrast. COMPARISON:  Abdominal ultrasound 12/02/2016 FINDINGS: CT CHEST FINDINGS Cardiovascular: Atherosclerotic calcifications aorta, coronary arteries, proximal great vessels. Aorta normal caliber. Small pericardial effusion. Mild LEFT ventricular dilatation. Displacement and compression of the IVC by mediastinal adenopathy the remains patent. Mediastinum/Nodes: Moderate-sized hiatal hernia question mild wall thickening of the midesophagus. Multiple enlarged mediastinal lymph nodes including a conglomerate nodal mass at the AP window 3.3 x 4.6 cm, pretracheal node 16 mm image 21, prevascular node 16 mm image 16, RIGHT superior mediastinal node 21 mm image 14, and necrotic subcarinal node 29 mm image 28. 17 mm RIGHT hilar node image 31. RIGHT superior hilar node 16 mm image 27. Adenopathy extends throughout the superior mediastinum on the RIGHT, displacing and compressing the IVC and extending to the base of the RIGHT cervical region. No axillary adenopathy. Unable to assess patency of the RIGHT subclavian vein. Lungs/Pleura: Small BILATERAL pleural effusions. Abnormal soft tissue/tumor surrounds the RIGHT and LEFT mainstem bronchi. Underlying emphysematous changes.  Dependent atelectasis in the posterior lower lobes. No definite pulmonary mass. 4 mm RIGHT upper lobe nodule image 33. 3 mm RIGHT upper lobe nodule image 63. Calcified granuloma anterior RIGHT lung. Few additional scattered tiny BILATERAL pulmonary nodules. 6 mm LEFT upper lobe nodule image 25. No infiltrate or pneumothorax. Musculoskeletal: No acute osseous lesions. CT ABDOMEN PELVIS FINDINGS Hepatobiliary: Subtle liver lesions question metastases including an 11 mm LEFT lobe lesion image 52, 9 mm RIGHT lobe lesion image 55, LEFT lobe lesion superiorly 8 mm image 50. Gallbladder unremarkable. Pancreas: Mild peripancreatic edema. Peripancreatic adenopathy. No discrete pancreatic mass or ductal dilatation. Spleen: Normal appearance Adrenals/Urinary Tract: BILATERAL adrenal nodules 17 x 12 mm LEFT and 9 x 6 mm RIGHT. LEFT renal cysts largest 4.5 x 3.4 cm image 72. Kidneys, ureters, and bladder otherwise normal appearance. Stomach/Bowel: Scattered stool throughout colon. Sigmoid diverticulosis without evidence of diverticulitis. No definite gastric or bowel mass. Normal appendix. Vascular/Lymphatic: Atherosclerotic calcifications aorta without aneurysm. Extensive adenopathy and mesenteric up to 15 mm short axis image 78. Peripancreatic, periportal and portal caval adenopathy up to 19 mm short axis image 62 and 16 mm image 56. Reproductive: Significant prostatic enlargement gland measuring 6.1 x 5.4 cm image 113. Other: Small amount of free fluid in pelvis. Significant edema in the mesenteric associated with mesenteric adenopathy. Multiple tumor nodules/implants in retroperitoneal fat in the flanks bilaterally. Possible peritoneal nodules anterior pelvis image 100 and LEFT pelvis image 94. BILATERAL  inguinal hernias containing fat. No free air. Musculoskeletal: No acute osseous findings. Lytic metastatic lesions at L2, L3, L5, LEFT iliac. Probable metastatic lesion S2 segment of sacrum. IMPRESSION: Extensive tumor  involvement in the chest, abdomen, and pelvis, with extensive adenopathy in the mediastinum and RIGHT hilum, mesenteric, periportal/peripancreatic. Small BILATERAL pulmonary nodules question pulmonary metastases. Lytic metastatic lesions in lumbar spine and pelvis. Small hepatic and adrenal metastases as well as retroperitoneal tumor deposits bilaterally and question of peritoneal deposits in the pelvis. No definite primary tumor is localized. Small BILATERAL pleural effusions. Electronically Signed   By: Lavonia Dana M.D.   On: 12/02/2016 18:27   Nm Pet Image Initial (pi) Skull Base To Thigh  Result Date: 12/12/2016 CLINICAL DATA:  Initial treatment strategy for lung cancer. EXAM: NUCLEAR MEDICINE PET SKULL BASE TO THIGH TECHNIQUE: 12.7 mCi F-18 FDG was injected intravenously. Full-ring PET imaging was performed from the skull base to thigh after the radiotracer. CT data was obtained and used for attenuation correction and anatomic localization. FASTING BLOOD GLUCOSE:  Value: 135 mg/dl COMPARISON:  CT scans dated 12/02/2016 FINDINGS: NECK The patient has known intracranial metastatic lesions are not well observed although the clivus metastatic focus is seen. Left station IIa lymph node measuring 9 mm in short axis on image 33/ 3 has maximum standard uptake value 4.7. Right level III lymph node about the level of the cricoid measures 7 mm in short axis on image 46/3 with maximum SUV 7.2. Left level IV masses extending into the paraspinal space and also extending into the supraclavicular space, with a component posteriorly having maximum SUV of 12.5. Left level V lymph node measures 1.2 cm in short axis on image 36/ 3 with maximum SUV 3.9. Small subcutaneous hypermetabolic lymph node along the left lower neck measures 8 mm in short axis on image 34/ 3 with maximum SUV 3.2. CHEST Extensive confluent supraclavicular, right paratracheal, left paratracheal, paraesophageal, anterior mediastinal, AP window, bilateral  hilar, subcarinal, and right infrahilar adenopathy noted along with some mildly hypermetabolic left internal mammary adenopathy. Index right lower paratracheal node 1.6 cm in short axis on image 83/3 with maximum SUV 12.2. A small immediately subcutaneous left axillary lymph node measures 1.0 cm in short axis on image 79/3 with maximum SUV 5.5. Small bilateral pleural effusions are present. There is associated passive atelectasis. Severe centrilobular emphysema. A 3 mm right apical pulmonary nodule on image 67/3 is not hypermetabolic but is well below sensitive PET-CT size thresholds. Moderate-sized hiatal hernia. Cardiomegaly with small pericardial effusion. ABDOMEN/PELVIS Several faint hypermetabolic foci are present in the liver suspicious for metastatic disease. These are poorly correlated on the CT data, but an inferior right hepatic lobe lesion has a maximum SUV of 6.0 near the right hepatic tip, well above background liver activity of approximately 3.3. There is extensive hypermetabolic porta hepatis, peripancreatic, mesenteric, and periaortic adenopathy along with scattered hypermetabolic nodules in the posterior perirenal space. An index peripancreatic lymph node measuring 1.5 cm in short axis on image 139/3 (Previously measured 1.6 cm) has a maximum SUV of 9.2. Bilateral small adrenal masses are present, right adrenal gland maximum SUV 7.0, left adrenal gland maximum SUV 8.9. Pelvic ascites noted with several small hypermetabolic peritoneal nodules in the pelvis. Prostatomegaly. Aortoiliac atherosclerotic vascular disease. Sigmoid colon diverticulosis. SKELETON Musculoskeletal hypermetabolic lesions include the clivus, left C2 vertebra, left proximal humerus, right fifth rib, L2 vertebral body and pedicle, right L3 transverse process, left L5 vertebral body, the S1 vertebra and lower sacral regions,  both iliac bones, the left anterior inferior acetabulum, the right proximal femur adjacent to the lesser  trochanter, and several questionable foci in the gluteus maximus muscles. Several other more subtle rib and vertebral lesions are present. Index metastatic lesion eccentric to the left the L5 vertebral level has maximum SUV 8.3. IMPRESSION: 1. Extensive widespread metastatic disease including the neck, mediastinum, hila, porta hepatis, mesentery, retroperitoneum, adrenal glands, liver, subcutaneous nodal involvement in several locations, and scattered osseous metastatic disease, including the clivus. 2. Other imaging findings of potential clinical significance: Small bilateral pleural effusions with passive atelectasis. Small pericardial effusion. Cardiomegaly. Moderate-sized hiatal hernia. Aortic Atherosclerosis (ICD10-I70.0) and Emphysema (ICD10-J43.9). Prostatomegaly. Sigmoid colon diverticulosis. Electronically Signed   By: Van Clines M.D.   On: 12/12/2016 12:02   US Biopsy  Result Date: 12/10/2016 CLINICAL DATA:  Lymphadenopathy in the right supraclavicular region, chest and abdomen. Additional liver lesions, lytic bone lesions and pulmonary nodules. The patient presents for biopsy of an enlarged right supraclavicular lymph node. EXAM: ULTRASOUND GUIDED CORE BIOPSY OF RIGHT SUPRACLAVICULAR LYMPH NODE MASS MEDICATIONS: 1.0 mg IV Versed; 50 mcg IV Fentanyl Total Moderate Sedation Time: 19 minutes. The patient's level of consciousness and physiologic status were continuously monitored during the procedure by Radiology nursing. PROCEDURE: The procedure, risks, benefits, and alternatives were explained to the patient. Questions regarding the procedure were encouraged and answered. The patient understands and consents to the procedure. A time out was performed prior to initiating the procedure. The right neck was prepped with chlorhexidine in a sterile fashion, and a sterile drape was applied covering the operative field. A sterile gown and sterile gloves were used for the procedure. Local anesthesia was  provided with 1% Lidocaine. Under ultrasound guidance, a total of 5 separate 18 gauge core biopsy samples were obtained of an enlarged right supraclavicular lymph node. Three samples were submitted in formalin. Two samples were submitted on Telfa soaked with sterile saline. COMPLICATIONS: None. FINDINGS: An enlarged right supraclavicular lymph node is irregular in shape and measures roughly 4.1 x 2.1 x 4.1 cm. Solid tissue was obtained from different portions of the lymph node. IMPRESSION: Ultrasound-guided core biopsy performed of an enlarged right supraclavicular lymph node. Electronically Signed   By: Aletta Edouard M.D.   On: 12/10/2016 14:41   US Abdomen Limited Ruq  Result Date: 12/02/2016 CLINICAL DATA:  Abdominal pain and belching for the past month. 15 pound weight loss over the past month. Ex-smoker. EXAM: ULTRASOUND ABDOMEN LIMITED RIGHT UPPER QUADRANT COMPARISON:  None. FINDINGS: Gallbladder: No gallstones or wall thickening visualized. No sonographic Murphy sign noted by sonographer. Common bile duct: Diameter: 3.7 mm Liver: Multiple rounded and oval hypoechoic masses within the liver. The largest mass measures 1.5 cm in maximum diameter. IMPRESSION: Multiple liver masses, most likely representing metastatic disease. Otherwise, normal examination. Electronically Signed   By: Claudie Revering M.D.   On: 12/02/2016 14:31    ASSESSMENT: Stage IVB adenocarcinoma of the lung  PLAN:   Patient his dehydrated with severe abdominal pain and has not had a bowel movement in over one week. He also has a chronic cough with wheezes. He is unable to eat or drink due to dysphagia. He is very weak. He is in severe pain. He would like pain medication. He is scheduled to start chemo this Friday.   1. Patient needs a chest x-ray, abdominal ultrasound and IVF. Spoke to on call admission hospitalist and they have agreed to directly admit the patient for all of the above reasons.  Patient expressed understanding  and was in agreement with this plan. He also understands that He can call clinic at any time with any questions, concerns, or complaints.   Cancer Staging Adenocarcinoma, lung, unspecified laterality [facility]) Staging form: Lung, AJCC 8th Edition - Clinical stage from 12/12/2016: Stage IVB (cTX, cN3, pM1c) - Signed by Lloyd Huger, MD on 12/15/2016   Jacquelin Hawking, NP   11/22/2016 2:01 PM

## 2016-12-17 NOTE — Progress Notes (Signed)
Advanced Care Plan.  Purpose of Encounter: CODE STATUS. Parties in Attendance: The patient, his son and me. Patient's Decisional Capacity: Yes. Medical Story: Keith Chandler  is a 82 y.o. male with a known history of Recently diagnosed lung cancer with widespread metastasis, hypertension, diabetes and neuropathy. 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 He has had severe abdominal pain, nausea, dysphagia, poor intake, weight loss and generalized weakness. He has very poor prognosis. I discussed with the patient and his son about CODE STATUS. Both the patient and his son wants full code for now. Plan:  Code Status: Full code Palliative care consult. Time spent discussing advance care planning: 18 minutes.

## 2016-12-17 NOTE — H&P (Addendum)
[facility] [place] NAME: Keith Chandler    MR#:  [id number]  DATE OF BIRTH:  [birth year]  DATE OF ADMISSION:  11/27/2016  PRIMARY CARE PHYSICIAN: [facility]   REQUESTING/REFERRING PHYSICIAN: Consuello Bossier, MD  CHIEF COMPLAINT:  No chief complaint on file.  Abdominal pain, dysphagia and poor oral intake for 1 week. HISTORY OF PRESENT ILLNESS:  Keith Chandler  is a 82 y.o. male with a known history of Recently diagnosed lung cancer with widespread metastasis, hypertension, diabetes and neuropathy. The patient has had hoarse voice and weight loss 30 lb for the past 6 weeks. He also complains of severe abdominal pain, nausea, dysphagia, poor intake and generalized weakness for 1 week. The patient was planned to get chemotherapy and radiation therapy. But since his condition is declining, the patient was sent to the hospital for direct admission by radiation oncologist. PAST MEDICAL HISTORY:   Past Medical History:  Diagnosis Date  . Diabetes mellitus without complication ([place])   . Hypertension   . Neuropathy     PAST SURGICAL HISTORY:   Past Surgical History:  Procedure Laterality Date  . CATARACT EXTRACTION, BILATERAL      SOCIAL HISTORY:   Social History  Substance Use Topics  . Smoking status: Former Smoker    Packs/day: 2.00    Years: 45.00    Types: Cigarettes  . Smokeless tobacco: Former Systems developer    Quit date: 05/21/1992  . Alcohol use No    FAMILY HISTORY:   Family History  Problem Relation Age of Onset  . Family history unknown: Yes    DRUG ALLERGIES:   Allergies  Allergen Reactions  . Sulfa Antibiotics     REVIEW OF SYSTEMS:   Review of Systems  Constitutional: Positive for malaise/fatigue and weight loss. Negative for chills and fever.  HENT: Negative for sore throat.        Dysphagia and hoarse voice  Eyes: Negative for blurred vision and double vision.  Respiratory: Positive for  cough, shortness of breath and wheezing. Negative for hemoptysis, sputum production and stridor.   Cardiovascular: Negative for chest pain, palpitations, orthopnea and leg swelling.  Gastrointestinal: Positive for abdominal pain, constipation, melena and nausea. Negative for blood in stool, diarrhea and vomiting.  Genitourinary: Negative for dysuria, flank pain and hematuria.  Musculoskeletal: Negative for back pain and joint pain.  Neurological: Positive for weakness. Negative for dizziness, sensory change, focal weakness, seizures, loss of consciousness and headaches.  Endo/Heme/Allergies: Negative for polydipsia.  Psychiatric/Behavioral: Negative for depression. The patient is nervous/anxious.     MEDICATIONS AT HOME:   Prior to Admission medications   Medication Sig Start Date End Date Taking? Authorizing Provider  aspirin EC 81 MG tablet Take 81 mg by mouth daily.    [provider]  atorvastatin (LIPITOR) 10 MG tablet Take 10 mg by mouth daily.    [provider]  dexamethasone (DECADRON) 4 MG tablet Take 1 tablet (4 mg total) by mouth daily. 12/13/16   Lloyd Huger, MD  fentaNYL (DURAGESIC - DOSED MCG/HR) 25 MCG/HR patch Place 1 patch (25 mcg total) onto the skin every 3 (three) days. 12/12/2016   Jacquelin Hawking, NP  folic acid (FOLVITE) 1 MG tablet Take 1 tablet (1 mg total) by mouth daily. 12/15/16   Lloyd Huger, MD  gabapentin (NEURONTIN) 600 MG tablet Take 600 mg by mouth 3 (three) times daily as needed.    [provider]  HYDROcodone-acetaminophen (NORCO) 5-325 MG tablet Take 1 tablet by mouth every 6 (six) hours as needed for moderate pain. 12/16/2016   Jacquelin Hawking, NP  lidocaine-prilocaine (EMLA) cream Apply to affected area once 12/15/16   Lloyd Huger, MD  megestrol (MEGACE) 40 MG tablet Take 1 tablet (40 mg total) by mouth daily. 11/25/2016   Jacquelin Hawking, NP  omeprazole (PRILOSEC) 20 MG capsule Take 1 capsule (20 mg total)  by mouth 2 (two) times daily before a meal. 12/03/16   Fritzi Mandes, MD  ondansetron (ZOFRAN) 8 MG tablet Take 1 tablet (8 mg total) by mouth every 8 (eight) hours as needed. 11/23/2016   Jacquelin Hawking, NP  prochlorperazine (COMPAZINE) 10 MG tablet Take 1 tablet (10 mg total) by mouth every 6 (six) hours as needed (Nausea or vomiting). 11/24/2016   Jacquelin Hawking, NP      VITAL SIGNS:  Blood pressure (!) 157/70, pulse 90, temperature 98.1 F (36.7 C), temperature source Oral, resp. rate 18, height 5\' 6"  (1.676 m), weight 181 lb 8 oz (82.3 kg), SpO2 90 %.  PHYSICAL EXAMINATION:  Physical Exam  GENERAL:  82 y.o.-year-old patient lying in the bed with no acute distress.  EYES: Pupils equal, round, reactive to light and accommodation. No scleral icterus. Extraocular muscles intact.  HEENT: Head atraumatic, normocephalic. Oropharynx and nasopharynx clear.  NECK:  Supple, no jugular venous distention. No thyroid enlargement, no tenderness.  LUNGS: Normal breath sounds bilaterally, no wheezing, rales, but has rhonchi. No use of accessory muscles of respiration.  CARDIOVASCULAR: S1, S2 normal. No murmurs, rubs, or gallops.  ABDOMEN: Soft,  Has tenderness, no nondistended. Bowel sounds present. No organomegaly or mass.  EXTREMITIES: No pedal edema, cyanosis, or clubbing.  NEUROLOGIC: Cranial nerves II through XII are intact. Muscle strength 5/5 in all extremities. Sensation intact. Gait not checked.  PSYCHIATRIC: The patient is alert and oriented x 3.  SKIN: No obvious rash, lesion, or ulcer.   LABORATORY PANEL:   CBC  Recent Labs Lab 12/01/2016 1120  WBC 11.4*  HGB 8.9*  HCT 26.7*  PLT 304   ------------------------------------------------------------------------------------------------------------------  Chemistries   Recent Labs Lab 11/27/2016 1120  NA 141  K 3.6  CL 108  CO2 22  GLUCOSE 167*  BUN 34*  CREATININE 1.40*  CALCIUM 8.7*  AST 20  ALT 14*  ALKPHOS 59  BILITOT  0.6   ------------------------------------------------------------------------------------------------------------------  Cardiac Enzymes No results for input(s): TROPONINI in the last 168 hours. ------------------------------------------------------------------------------------------------------------------  RADIOLOGY:  No results found.    IMPRESSION AND PLAN:   Dysphagia, poor oral intake and hoarse voice due to Lung cancer stage IV B The patient is admitted directly from clinic to the medical floor. Keep nothing by mouth except medications, speech study, IV fluid support, Zofran when necessary, oncology consult. Abdominal pain. Possible due to metastases, follow-up ultrasound of abdomen. Lung cancer stage IV B, with bone and brain metastasis.  Continue dexamethasone, oncology consult and palliative care consult.  Acute renal failure with dehydration. IV fluid support and follow BMP. Diabetes. Start sliding scale.  Iron deficiency anemia: Will give 2 doses of IV Feraheme over the next several weeks per radiation oncologist.  The patient has very poor prognosis, palliative care consult. All the records are reviewed and case discussed with ED provider. Management plans discussed with the patient, his son and they are in agreement.  CODE STATUS: Full Code  TOTAL TIME TAKING CARE OF THIS PATIENT: 53 minutes.  Demetrios Loll M.D on 11/21/2016 at 2:58 PM  Between 7am to 6pm - Pager - [phone]  After 6pm go to [email or web address] - [employer] [facility]  Office  [phone]  CC: Primary care physician; [facility]   Note: This dictation was prepared with Dragon dictation along with smaller phrase technology. Any transcriptional errors that result from this process are unintentional.

## 2016-12-17 NOTE — Progress Notes (Signed)
[name]  [phone] [phone]  ID: Keith Millet OB: [birth year]  MR#: [id number]  VOH#:[id number]  Patient Care Team: Kathlene November. Grayland Ormond, M.D. [facility], [employer] as PCP - General (General Practice)  CHIEF COMPLAINT: Stage IVB adenocarcinoma of the lung  INTERVAL HISTORY: [name] is an 82 year old white male who was in reasonable health until 6 weeks ago when he began to experience fatigue,   REVIEW OF SYSTEMS:   Review of Systems  Constitutional: Positive for malaise/fatigue and weight loss. Negative for fever.  Respiratory: Positive for cough. Negative for hemoptysis and shortness of breath.   Cardiovascular: Negative.  Negative for chest pain and leg swelling.  Gastrointestinal: Positive for nausea. Negative for abdominal pain, constipation, diarrhea and vomiting.  Genitourinary: Negative.   Musculoskeletal: Negative.   Skin: Negative.  Negative for rash.  Neurological: Positive for weakness.  Psychiatric/Behavioral: Negative.  The patient is not nervous/anxious.     As per HPI. Otherwise, a complete review of systems is negative.  PAST MEDICAL HISTORY:     Past Medical History:  Diagnosis Date  . Diabetes mellitus without complication ([place])   . Hypertension   . Neuropathy     PAST SURGICAL HISTORY:      Past Surgical History:  Procedure Laterality Date  . CATARACT EXTRACTION, BILATERAL      FAMILY HISTORY:      Family History  Problem Relation Age of Onset  . Family history unknown: Yes    ADVANCED DIRECTIVES (Y/N):  N  HEALTH MAINTENANCE:      Social History  Substance Use Topics  . Smoking status: Former Smoker    Packs/day: 2.00    Years: 45.00    Types: Cigarettes  . Smokeless tobacco: Former Systems developer    Quit date: 05/21/1992  . Alcohol use No                Colonoscopy:             PAP:             Bone density:             Lipid panel:        Allergies  Allergen Reactions  . Sulfa Antibiotics           Current Outpatient Prescriptions  Medication Sig Dispense Refill  . aspirin EC 81 MG tablet Take 81 mg by mouth daily.    Marland Kitchen atorvastatin (LIPITOR) 10 MG tablet Take 10 mg by mouth daily.    Marland Kitchen gabapentin (NEURONTIN) 600 MG tablet Take 600 mg by mouth 3 (three) times daily as needed.    . megestrol (MEGACE) 40 MG tablet Take 1 tablet (40 mg total) by mouth daily. 30 tablet 1  . omeprazole (PRILOSEC) 20 MG capsule Take 1 capsule (20 mg total) by mouth 2 (two) times daily before a meal. 60 capsule 1  . dexamethasone (DECADRON) 4 MG tablet Take 1 tablet (4 mg total) by mouth daily. 30 tablet 0  . folic acid (FOLVITE) 1 MG tablet Take 1 tablet (1 mg total) by mouth daily. 90 tablet 3  . lidocaine-prilocaine (EMLA) cream Apply to affected area once 30 g 3  . ondansetron (ZOFRAN) 8 MG tablet Take 1 tablet (8 mg total) by mouth every 8 (eight) hours as needed. 60 tablet 2  . prochlorperazine (COMPAZINE) 10 MG tablet Take 1 tablet (10 mg total) by mouth every 6 (six) hours as needed (Nausea or  vomiting). 60 tablet 2   No current facility-administered medications for this visit.     OBJECTIVE:    Vitals:   12/13/16 1532  BP: 105/70  Pulse: 97  Resp: 18  Temp: 97.8 F (36.6 C)     Body mass index is 28.44 kg/m.    ECOG FS:1 - Symptomatic but completely ambulatory  General: Well-developed, well-nourished, no acute distress. Eyes: Pink conjunctiva, anicteric sclera. HEENT: Normocephalic, moist mucous membranes, clear oropharnyx. Lungs: Clear to auscultation bilaterally. Heart: Regular rate and rhythm. No rubs, murmurs, or gallops. Abdomen: Soft, nontender, nondistended. No organomegaly noted, normoactive bowel sounds. Musculoskeletal: No edema, cyanosis, or clubbing. Neuro: Alert, answering all questions appropriately. Cranial nerves grossly intact. Skin: No rashes or petechiae noted. Psych: Normal  affect. Lymphatics: No cervical, calvicular, axillary or inguinal LAD.   LAB RESULTS:  Recent Labs       Lab Results  Component Value Date   NA 143 12/13/2016   K 3.1 (L) 12/13/2016   CL 109 12/13/2016   CO2 24 12/13/2016   GLUCOSE 149 (H) 12/13/2016   BUN 34 (H) 12/13/2016   CREATININE 1.45 (H) 12/13/2016   CALCIUM 8.5 (L) 12/13/2016   PROT 6.6 12/13/2016   ALBUMIN 3.1 (L) 12/13/2016   AST 21 12/13/2016   ALT 15 (L) 12/13/2016   ALKPHOS 57 12/13/2016   BILITOT 0.4 12/13/2016   GFRNONAA 44 (L) 12/13/2016   GFRAA 51 (L) 12/13/2016      Recent Labs       Lab Results  Component Value Date   WBC 10.9 (H) 12/13/2016   NEUTROABS 8.0 (H) 12/13/2016   HGB 8.7 (L) 12/13/2016   HCT 26.2 (L) 12/13/2016   MCV 88.4 12/13/2016   PLT 276 12/13/2016       STUDIES:  Imaging Results  Dg Chest 2 View  Result Date: 12/02/2016 CLINICAL DATA:  Fatigue, weakness, chest pressure and cough EXAM: CHEST  2 VIEW COMPARISON:  None. FINDINGS: Mild cardiomegaly with vascular congestion. Chronic nonspecific interstitial prominence without definite edema. No significant effusion or pneumothorax. No focal pneumonia, collapse or consolidation. Trachea is midline. Atherosclerosis of aorta. Degenerative changes of the spine. No compression fracture. IMPRESSION: Cardiomegaly with vascular congestion Mild chronic interstitial changes suspected. No definite superimposed CHF or pneumonia. Electronically Signed   By: Jerilynn Mages.  Shick M.D.   On: 12/02/2016 12:51   Ct Chest W Contrast  Result Date: 12/02/2016 CLINICAL DATA:  Abnormal ultrasound showing liver masses, epigastric discomfort due to gas, lays, 15 pounds weight loss, loss of appetite, generalized weakness, type II diabetes mellitus, hypertension, former smoker EXAM: CT CHEST, ABDOMEN, AND PELVIS WITH CONTRAST TECHNIQUE: Multidetector CT imaging of the chest, abdomen and pelvis was performed following the standard protocol  during bolus administration of intravenous contrast. Sagittal and coronal MPR images reconstructed from axial data set. CONTRAST:  21mL ISOVUE-300 IOPAMIDOL (ISOVUE-300) INJECTION 61% IV. Dilute oral contrast. COMPARISON:  Abdominal ultrasound 12/02/2016 FINDINGS: CT CHEST FINDINGS Cardiovascular: Atherosclerotic calcifications aorta, coronary arteries, proximal great vessels. Aorta normal caliber. Small pericardial effusion. Mild LEFT ventricular dilatation. Displacement and compression of the IVC by mediastinal adenopathy the remains patent. Mediastinum/Nodes: Moderate-sized hiatal hernia question mild wall thickening of the midesophagus. Multiple enlarged mediastinal lymph nodes including a conglomerate nodal mass at the AP window 3.3 x 4.6 cm, pretracheal node 16 mm image 21, prevascular node 16 mm image 16, RIGHT superior mediastinal node 21 mm image 14, and necrotic subcarinal node 29 mm image 28. 17 mm RIGHT hilar node image  31. RIGHT superior hilar node 16 mm image 27. Adenopathy extends throughout the superior mediastinum on the RIGHT, displacing and compressing the IVC and extending to the base of the RIGHT cervical region. No axillary adenopathy. Unable to assess patency of the RIGHT subclavian vein. Lungs/Pleura: Small BILATERAL pleural effusions. Abnormal soft tissue/tumor surrounds the RIGHT and LEFT mainstem bronchi. Underlying emphysematous changes. Dependent atelectasis in the posterior lower lobes. No definite pulmonary mass. 4 mm RIGHT upper lobe nodule image 33. 3 mm RIGHT upper lobe nodule image 63. Calcified granuloma anterior RIGHT lung. Few additional scattered tiny BILATERAL pulmonary nodules. 6 mm LEFT upper lobe nodule image 25. No infiltrate or pneumothorax. Musculoskeletal: No acute osseous lesions. CT ABDOMEN PELVIS FINDINGS Hepatobiliary: Subtle liver lesions question metastases including an 11 mm LEFT lobe lesion image 52, 9 mm RIGHT lobe lesion image 55, LEFT lobe lesion superiorly 8  mm image 50. Gallbladder unremarkable. Pancreas: Mild peripancreatic edema. Peripancreatic adenopathy. No discrete pancreatic mass or ductal dilatation. Spleen: Normal appearance Adrenals/Urinary Tract: BILATERAL adrenal nodules 17 x 12 mm LEFT and 9 x 6 mm RIGHT. LEFT renal cysts largest 4.5 x 3.4 cm image 72. Kidneys, ureters, and bladder otherwise normal appearance. Stomach/Bowel: Scattered stool throughout colon. Sigmoid diverticulosis without evidence of diverticulitis. No definite gastric or bowel mass. Normal appendix. Vascular/Lymphatic: Atherosclerotic calcifications aorta without aneurysm. Extensive adenopathy and mesenteric up to 15 mm short axis image 78. Peripancreatic, periportal and portal caval adenopathy up to 19 mm short axis image 62 and 16 mm image 56. Reproductive: Significant prostatic enlargement gland measuring 6.1 x 5.4 cm image 113. Other: Small amount of free fluid in pelvis. Significant edema in the mesenteric associated with mesenteric adenopathy. Multiple tumor nodules/implants in retroperitoneal fat in the flanks bilaterally. Possible peritoneal nodules anterior pelvis image 100 and LEFT pelvis image 94. BILATERAL inguinal hernias containing fat. No free air. Musculoskeletal: No acute osseous findings. Lytic metastatic lesions at L2, L3, L5, LEFT iliac. Probable metastatic lesion S2 segment of sacrum. IMPRESSION: Extensive tumor involvement in the chest, abdomen, and pelvis, with extensive adenopathy in the mediastinum and RIGHT hilum, mesenteric, periportal/peripancreatic. Small BILATERAL pulmonary nodules question pulmonary metastases. Lytic metastatic lesions in lumbar spine and pelvis. Small hepatic and adrenal metastases as well as retroperitoneal tumor deposits bilaterally and question of peritoneal deposits in the pelvis. No definite primary tumor is localized. Small BILATERAL pleural effusions. Electronically Signed   By: Lavonia Dana M.D.   On: 12/02/2016 18:27   Mr Jeri Cos  NW Contrast  Result Date: 12/12/2016 CLINICAL DATA:  Initial staging of metastatic cancer. EXAM: MRI HEAD WITHOUT AND WITH CONTRAST TECHNIQUE: Multiplanar, multiecho pulse sequences of the brain and surrounding structures were obtained without and with intravenous contrast. CONTRAST:  77mL MULTIHANCE GADOBENATE DIMEGLUMINE 529 MG/ML IV SOLN COMPARISON:  None. FINDINGS: Brain: There is no evidence of acute infarct, intracranial hemorrhage, midline shift, or extra-axial fluid collection. There is moderate generalized cerebral atrophy. Scattered foci of cerebral white matter T2 hyperintensity are nonspecific but compatible with mild chronic small vessel ischemic disease. There is a 9 mm enhancing lesion with minimal surrounding edema in the right cerebellar hemisphere (series 12, image 36). A 6 mm enhancing lesion in the medial left occipital lobe also has minimal surrounding edema (series 12, image 72). There is a 3 mm enhancing lesion in the posterior left frontal operculum without edema (series 12, image 88). Vascular: Major intracranial vascular flow voids are preserved. Skull and upper cervical spine: Approximately 2 cm lesion in the clivus,  predominantly right of midline. Additional lesion involving C2 on the left. Sinuses/Orbits: Bilateral cataract extraction. No significant sinus disease. Other: None. IMPRESSION: 1. Three subcentimeter enhancing brain lesions consistent with metastases. Minimal edema. 2. Osseous metastases in the clivus and C2. 3. Mild chronic small vessel ischemic disease and moderate cerebral atrophy. Electronically Signed   By: Logan Bores M.D.   On: 12/12/2016 11:34   Ct Abdomen Pelvis W Contrast  Result Date: 12/02/2016 CLINICAL DATA:  Abnormal ultrasound showing liver masses, epigastric discomfort due to gas, lays, 15 pounds weight loss, loss of appetite, generalized weakness, type II diabetes mellitus, hypertension, former smoker EXAM: CT CHEST, ABDOMEN, AND PELVIS WITH CONTRAST  TECHNIQUE: Multidetector CT imaging of the chest, abdomen and pelvis was performed following the standard protocol during bolus administration of intravenous contrast. Sagittal and coronal MPR images reconstructed from axial data set. CONTRAST:  61mL ISOVUE-300 IOPAMIDOL (ISOVUE-300) INJECTION 61% IV. Dilute oral contrast. COMPARISON:  Abdominal ultrasound 12/02/2016 FINDINGS: CT CHEST FINDINGS Cardiovascular: Atherosclerotic calcifications aorta, coronary arteries, proximal great vessels. Aorta normal caliber. Small pericardial effusion. Mild LEFT ventricular dilatation. Displacement and compression of the IVC by mediastinal adenopathy the remains patent. Mediastinum/Nodes: Moderate-sized hiatal hernia question mild wall thickening of the midesophagus. Multiple enlarged mediastinal lymph nodes including a conglomerate nodal mass at the AP window 3.3 x 4.6 cm, pretracheal node 16 mm image 21, prevascular node 16 mm image 16, RIGHT superior mediastinal node 21 mm image 14, and necrotic subcarinal node 29 mm image 28. 17 mm RIGHT hilar node image 31. RIGHT superior hilar node 16 mm image 27. Adenopathy extends throughout the superior mediastinum on the RIGHT, displacing and compressing the IVC and extending to the base of the RIGHT cervical region. No axillary adenopathy. Unable to assess patency of the RIGHT subclavian vein. Lungs/Pleura: Small BILATERAL pleural effusions. Abnormal soft tissue/tumor surrounds the RIGHT and LEFT mainstem bronchi. Underlying emphysematous changes. Dependent atelectasis in the posterior lower lobes. No definite pulmonary mass. 4 mm RIGHT upper lobe nodule image 33. 3 mm RIGHT upper lobe nodule image 63. Calcified granuloma anterior RIGHT lung. Few additional scattered tiny BILATERAL pulmonary nodules. 6 mm LEFT upper lobe nodule image 25. No infiltrate or pneumothorax. Musculoskeletal: No acute osseous lesions. CT ABDOMEN PELVIS FINDINGS Hepatobiliary: Subtle liver lesions question  metastases including an 11 mm LEFT lobe lesion image 52, 9 mm RIGHT lobe lesion image 55, LEFT lobe lesion superiorly 8 mm image 50. Gallbladder unremarkable. Pancreas: Mild peripancreatic edema. Peripancreatic adenopathy. No discrete pancreatic mass or ductal dilatation. Spleen: Normal appearance Adrenals/Urinary Tract: BILATERAL adrenal nodules 17 x 12 mm LEFT and 9 x 6 mm RIGHT. LEFT renal cysts largest 4.5 x 3.4 cm image 72. Kidneys, ureters, and bladder otherwise normal appearance. Stomach/Bowel: Scattered stool throughout colon. Sigmoid diverticulosis without evidence of diverticulitis. No definite gastric or bowel mass. Normal appendix. Vascular/Lymphatic: Atherosclerotic calcifications aorta without aneurysm. Extensive adenopathy and mesenteric up to 15 mm short axis image 78. Peripancreatic, periportal and portal caval adenopathy up to 19 mm short axis image 62 and 16 mm image 56. Reproductive: Significant prostatic enlargement gland measuring 6.1 x 5.4 cm image 113. Other: Small amount of free fluid in pelvis. Significant edema in the mesenteric associated with mesenteric adenopathy. Multiple tumor nodules/implants in retroperitoneal fat in the flanks bilaterally. Possible peritoneal nodules anterior pelvis image 100 and LEFT pelvis image 94. BILATERAL inguinal hernias containing fat. No free air. Musculoskeletal: No acute osseous findings. Lytic metastatic lesions at L2, L3, L5, LEFT iliac. Probable metastatic lesion  S2 segment of sacrum. IMPRESSION: Extensive tumor involvement in the chest, abdomen, and pelvis, with extensive adenopathy in the mediastinum and RIGHT hilum, mesenteric, periportal/peripancreatic. Small BILATERAL pulmonary nodules question pulmonary metastases. Lytic metastatic lesions in lumbar spine and pelvis. Small hepatic and adrenal metastases as well as retroperitoneal tumor deposits bilaterally and question of peritoneal deposits in the pelvis. No definite primary tumor is localized.  Small BILATERAL pleural effusions. Electronically Signed   By: Lavonia Dana M.D.   On: 12/02/2016 18:27   Nm Pet Image Initial (pi) Skull Base To Thigh  Result Date: 12/12/2016 CLINICAL DATA:  Initial treatment strategy for lung cancer. EXAM: NUCLEAR MEDICINE PET SKULL BASE TO THIGH TECHNIQUE: 12.7 mCi F-18 FDG was injected intravenously. Full-ring PET imaging was performed from the skull base to thigh after the radiotracer. CT data was obtained and used for attenuation correction and anatomic localization. FASTING BLOOD GLUCOSE:  Value: 135 mg/dl COMPARISON:  CT scans dated 12/02/2016 FINDINGS: NECK The patient has known intracranial metastatic lesions are not well observed although the clivus metastatic focus is seen. Left station IIa lymph node measuring 9 mm in short axis on image 33/ 3 has maximum standard uptake value 4.7. Right level III lymph node about the level of the cricoid measures 7 mm in short axis on image 46/3 with maximum SUV 7.2. Left level IV masses extending into the paraspinal space and also extending into the supraclavicular space, with a component posteriorly having maximum SUV of 12.5. Left level V lymph node measures 1.2 cm in short axis on image 36/ 3 with maximum SUV 3.9. Small subcutaneous hypermetabolic lymph node along the left lower neck measures 8 mm in short axis on image 34/ 3 with maximum SUV 3.2. CHEST Extensive confluent supraclavicular, right paratracheal, left paratracheal, paraesophageal, anterior mediastinal, AP window, bilateral hilar, subcarinal, and right infrahilar adenopathy noted along with some mildly hypermetabolic left internal mammary adenopathy. Index right lower paratracheal node 1.6 cm in short axis on image 83/3 with maximum SUV 12.2. A small immediately subcutaneous left axillary lymph node measures 1.0 cm in short axis on image 79/3 with maximum SUV 5.5. Small bilateral pleural effusions are present. There is associated passive atelectasis. Severe  centrilobular emphysema. A 3 mm right apical pulmonary nodule on image 67/3 is not hypermetabolic but is well below sensitive PET-CT size thresholds. Moderate-sized hiatal hernia. Cardiomegaly with small pericardial effusion. ABDOMEN/PELVIS Several faint hypermetabolic foci are present in the liver suspicious for metastatic disease. These are poorly correlated on the CT data, but an inferior right hepatic lobe lesion has a maximum SUV of 6.0 near the right hepatic tip, well above background liver activity of approximately 3.3. There is extensive hypermetabolic porta hepatis, peripancreatic, mesenteric, and periaortic adenopathy along with scattered hypermetabolic nodules in the posterior perirenal space. An index peripancreatic lymph node measuring 1.5 cm in short axis on image 139/3 (Previously measured 1.6 cm) has a maximum SUV of 9.2. Bilateral small adrenal masses are present, right adrenal gland maximum SUV 7.0, left adrenal gland maximum SUV 8.9. Pelvic ascites noted with several small hypermetabolic peritoneal nodules in the pelvis. Prostatomegaly. Aortoiliac atherosclerotic vascular disease. Sigmoid colon diverticulosis. SKELETON Musculoskeletal hypermetabolic lesions include the clivus, left C2 vertebra, left proximal humerus, right fifth rib, L2 vertebral body and pedicle, right L3 transverse process, left L5 vertebral body, the S1 vertebra and lower sacral regions, both iliac bones, the left anterior inferior acetabulum, the right proximal femur adjacent to the lesser trochanter, and several questionable foci in the gluteus  maximus muscles. Several other more subtle rib and vertebral lesions are present. Index metastatic lesion eccentric to the left the L5 vertebral level has maximum SUV 8.3. IMPRESSION: 1. Extensive widespread metastatic disease including the neck, mediastinum, hila, porta hepatis, mesentery, retroperitoneum, adrenal glands, liver, subcutaneous nodal involvement in several locations, and  scattered osseous metastatic disease, including the clivus. 2. Other imaging findings of potential clinical significance: Small bilateral pleural effusions with passive atelectasis. Small pericardial effusion. Cardiomegaly. Moderate-sized hiatal hernia. Aortic Atherosclerosis (ICD10-I70.0) and Emphysema (ICD10-J43.9). Prostatomegaly. Sigmoid colon diverticulosis. Electronically Signed   By: Van Clines M.D.   On: 12/12/2016 12:02   US Biopsy  Result Date: 12/10/2016 CLINICAL DATA:  Lymphadenopathy in the right supraclavicular region, chest and abdomen. Additional liver lesions, lytic bone lesions and pulmonary nodules. The patient presents for biopsy of an enlarged right supraclavicular lymph node. EXAM: ULTRASOUND GUIDED CORE BIOPSY OF RIGHT SUPRACLAVICULAR LYMPH NODE MASS MEDICATIONS: 1.0 mg IV Versed; 50 mcg IV Fentanyl Total Moderate Sedation Time: 19 minutes. The patient's level of consciousness and physiologic status were continuously monitored during the procedure by Radiology nursing. PROCEDURE: The procedure, risks, benefits, and alternatives were explained to the patient. Questions regarding the procedure were encouraged and answered. The patient understands and consents to the procedure. A time out was performed prior to initiating the procedure. The right neck was prepped with chlorhexidine in a sterile fashion, and a sterile drape was applied covering the operative field. A sterile gown and sterile gloves were used for the procedure. Local anesthesia was provided with 1% Lidocaine. Under ultrasound guidance, a total of 5 separate 18 gauge core biopsy samples were obtained of an enlarged right supraclavicular lymph node. Three samples were submitted in formalin. Two samples were submitted on Telfa soaked with sterile saline. COMPLICATIONS: None. FINDINGS: An enlarged right supraclavicular lymph node is irregular in shape and measures roughly 4.1 x 2.1 x 4.1 cm. Solid tissue was obtained from  different portions of the lymph node. IMPRESSION: Ultrasound-guided core biopsy performed of an enlarged right supraclavicular lymph node. Electronically Signed   By: Aletta Edouard M.D.   On: 12/10/2016 14:41   US Abdomen Limited Ruq  Result Date: 12/02/2016 CLINICAL DATA:  Abdominal pain and belching for the past month. 15 pound weight loss over the past month. Ex-smoker. EXAM: ULTRASOUND ABDOMEN LIMITED RIGHT UPPER QUADRANT COMPARISON:  None. FINDINGS: Gallbladder: No gallstones or wall thickening visualized. No sonographic Murphy sign noted by sonographer. Common bile duct: Diameter: 3.7 mm Liver: Multiple rounded and oval hypoechoic masses within the liver. The largest mass measures 1.5 cm in maximum diameter. IMPRESSION: Multiple liver masses, most likely representing metastatic disease. Otherwise, normal examination. Electronically Signed   By: Claudie Revering M.D.   On: 12/02/2016 14:31     ASSESSMENT: Stage IVB adenocarcinoma of the lung  PLAN:    1. Stage IVB adenocarcinoma of the lung: Imaging results reviewed independently and reported as above with widespread metastatic disease including 3 brain lesions. Pathology discussed with pathologist. Despite patient's declining performance status, he wishes to pursue aggressive treatment. PDL 1 testing is pending, but patient will receive Keytruda plus pemetrexed every 3 weeks for 4 cycles and then continue with Keytruda until progression of disease. If patient tolerates treatment well, can add carboplatinum to this regimen for cycle 2. Patient will require port placement. Return to clinic on 01/07/2017 for further evaluation and consideration of cycle 1. Patient will then return the following week to assess his toleration of treatment and  to determine if carboplatinum can be added. 2. Brain metastasis: Patient was given a referral to radiation oncology. 3. Poor appetite/nausea: Patient was given a prescription for 4 mg Decadron and dietary  referral. 4. Bony metastasis: Patient will require Zometa on the odd numbered cycles. 5. Iron deficiency anemia: Will give 2 doses of IV Feraheme over the next several weeks.  Approximately 30 minutes was spent in discussion of which greater than 50% was consultation.  Patient expressed understanding and was in agreement with this plan. He also understands that He can call clinic at any time with any questions, concerns, or complaints.   Cancer Staging Adenocarcinoma, lung, unspecified laterality ([place]) Staging form: Lung, AJCC 8th Edition - Clinical stage from 12/12/2016: Stage IVB (cTX, cN3, pM1c) - Douglas A. Claudie Leach, M.D., M.P.H. On 11/22/2016   Douglas A. Claudie Leach, MD, MPH

## 2016-12-17 NOTE — Telephone Encounter (Signed)
Daughter called the answering service this weekend to report that he was having chest pains. The symptom resolved and the patient is seeing the Doctor today.

## 2016-12-18 ENCOUNTER — Telehealth: Payer: Self-pay | Admitting: *Deleted

## 2016-12-18 ENCOUNTER — Ambulatory Visit: Payer: Medicare Other

## 2016-12-18 ENCOUNTER — Inpatient Hospital Stay: Payer: Medicare Other

## 2016-12-18 DIAGNOSIS — C770 Secondary and unspecified malignant neoplasm of lymph nodes of head, face and neck: Secondary | ICD-10-CM

## 2016-12-18 DIAGNOSIS — R5383 Other fatigue: Secondary | ICD-10-CM

## 2016-12-18 DIAGNOSIS — K449 Diaphragmatic hernia without obstruction or gangrene: Secondary | ICD-10-CM

## 2016-12-18 DIAGNOSIS — C7951 Secondary malignant neoplasm of bone: Secondary | ICD-10-CM

## 2016-12-18 DIAGNOSIS — C7931 Secondary malignant neoplasm of brain: Secondary | ICD-10-CM

## 2016-12-18 DIAGNOSIS — C3490 Malignant neoplasm of unspecified part of unspecified bronchus or lung: Secondary | ICD-10-CM

## 2016-12-18 DIAGNOSIS — Z87891 Personal history of nicotine dependence: Secondary | ICD-10-CM

## 2016-12-18 DIAGNOSIS — R531 Weakness: Secondary | ICD-10-CM

## 2016-12-18 DIAGNOSIS — R05 Cough: Secondary | ICD-10-CM

## 2016-12-18 DIAGNOSIS — R490 Dysphonia: Secondary | ICD-10-CM

## 2016-12-18 DIAGNOSIS — C786 Secondary malignant neoplasm of retroperitoneum and peritoneum: Secondary | ICD-10-CM

## 2016-12-18 DIAGNOSIS — C787 Secondary malignant neoplasm of liver and intrahepatic bile duct: Secondary | ICD-10-CM

## 2016-12-18 DIAGNOSIS — R140 Abdominal distension (gaseous): Secondary | ICD-10-CM

## 2016-12-18 DIAGNOSIS — Z7982 Long term (current) use of aspirin: Secondary | ICD-10-CM

## 2016-12-18 DIAGNOSIS — R0602 Shortness of breath: Secondary | ICD-10-CM

## 2016-12-18 DIAGNOSIS — R1084 Generalized abdominal pain: Secondary | ICD-10-CM

## 2016-12-18 DIAGNOSIS — Z79899 Other long term (current) drug therapy: Secondary | ICD-10-CM

## 2016-12-18 DIAGNOSIS — R638 Other symptoms and signs concerning food and fluid intake: Secondary | ICD-10-CM

## 2016-12-18 DIAGNOSIS — C7970 Secondary malignant neoplasm of unspecified adrenal gland: Secondary | ICD-10-CM

## 2016-12-18 DIAGNOSIS — I10 Essential (primary) hypertension: Secondary | ICD-10-CM

## 2016-12-18 DIAGNOSIS — R51 Headache: Secondary | ICD-10-CM

## 2016-12-18 DIAGNOSIS — R634 Abnormal weight loss: Secondary | ICD-10-CM

## 2016-12-18 DIAGNOSIS — R5381 Other malaise: Secondary | ICD-10-CM

## 2016-12-18 DIAGNOSIS — Z515 Encounter for palliative care: Secondary | ICD-10-CM

## 2016-12-18 DIAGNOSIS — J90 Pleural effusion, not elsewhere classified: Secondary | ICD-10-CM

## 2016-12-18 DIAGNOSIS — E1142 Type 2 diabetes mellitus with diabetic polyneuropathy: Secondary | ICD-10-CM

## 2016-12-18 LAB — BASIC METABOLIC PANEL
Anion gap: 7 (ref 5–15)
BUN: 33 mg/dL — AB (ref 6–20)
CHLORIDE: 115 mmol/L — AB (ref 101–111)
CO2: 24 mmol/L (ref 22–32)
CREATININE: 1.21 mg/dL (ref 0.61–1.24)
Calcium: 8.1 mg/dL — ABNORMAL LOW (ref 8.9–10.3)
GFR calc Af Amer: >60 mL/min (ref >60)
GFR calc non Af Amer: 55 mL/min — ABNORMAL LOW (ref >60)
GLUCOSE: 128 mg/dL — AB (ref 65–99)
POTASSIUM: 3.7 mmol/L (ref 3.5–5.1)
Sodium: 146 mmol/L — ABNORMAL HIGH (ref 135–145)

## 2016-12-18 LAB — GLUCOSE, CAPILLARY
GLUCOSE-CAPILLARY: 112 mg/dL — AB (ref 65–99)
GLUCOSE-CAPILLARY: 130 mg/dL — AB (ref 65–99)
Glucose-Capillary: 126 mg/dL — ABNORMAL HIGH (ref 65–99)
Glucose-Capillary: 136 mg/dL — ABNORMAL HIGH (ref 65–99)
Glucose-Capillary: 118 mg/dL — ABNORMAL HIGH (ref 65–99)

## 2016-12-18 MED ORDER — MORPHINE SULFATE (PF) 2 MG/ML IV SOLN
2.0000 mg | INTRAVENOUS | Status: DC | PRN
  Administered 2016-12-18 – 2016-12-19 (×2): 2 mg via INTRAVENOUS
  Filled 2016-12-18 (×2): qty 1

## 2016-12-18 MED ORDER — SIMETHICONE 80 MG PO CHEW
80.0000 mg | CHEWABLE_TABLET | Freq: Four times a day (QID) | ORAL | Status: DC | PRN
  Administered 2016-12-18: 03:00:00 80 mg via ORAL
  Filled 2016-12-18 (×2): qty 1

## 2016-12-18 MED ORDER — DEXAMETHASONE SODIUM PHOSPHATE 4 MG/ML IJ SOLN
4.0000 mg | INTRAMUSCULAR | Status: DC
  Administered 2016-12-18: 4 mg via INTRAVENOUS
  Filled 2016-12-18: qty 1

## 2016-12-18 MED ORDER — PANTOPRAZOLE SODIUM 40 MG IV SOLR
40.0000 mg | INTRAVENOUS | Status: DC
  Administered 2016-12-18 – 2016-12-20 (×3): 40 mg via INTRAVENOUS
  Filled 2016-12-18 (×3): qty 40

## 2016-12-18 MED ORDER — ASPIRIN 300 MG RE SUPP
150.0000 mg | Freq: Every day | RECTAL | Status: DC
  Administered 2016-12-19 – 2016-12-20 (×2): 150 mg via RECTAL
  Filled 2016-12-18 (×2): qty 1

## 2016-12-18 NOTE — Evaluation (Addendum)
Clinical/Bedside Swallow Evaluation Patient Details  Name: Keith Chandler MRN: [id number] Date of Birth: 07/15/1934  Today's Date: 12/18/2016 Time: SLP Start Time (ACUTE ONLY): 1115 SLP Stop Time (ACUTE ONLY): 1215 SLP Time Calculation (min) (ACUTE ONLY): 60 min  Past Medical History:  Past Medical History:  Diagnosis Date  . Diabetes mellitus without complication ([place])   . Hypertension   . Neuropathy    Past Surgical History:  Past Surgical History:  Procedure Laterality Date  . CATARACT EXTRACTION, BILATERAL     HPI:  Pt is a 82 y.o. male with a known history of recently diagnosed lung cancer with widespread metastasis, hypertension, diabetes and neuropathy. 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 He has had severe abdominal pain, nausea, dysphagia, poor intake, weight loss of ~30 lbs recently and generalized weakness. He has very poor prognosis per MDs. Patient returned to cancer center clinic due to severe periumbilical abdominal pain, dysphagia and dehydration. He was scheduled to begin chemo on Friday but has been progressively getting weak and has been unable to swallow anything, even water and medications. Eating makes him nauseated and causes throat pain. He continues to have a chronic cough and shortness of breath. He has severe nausea without vomiting constantly. Pt appears weak and has stridor-like respirations at rest which increase w/ any exertion. Pt's vocal quality is significantly declined w/ much reduced vocal cord contact for phonation or conversation; volume is minimal. Pt and family stated the MDs have told them that the lymph nodes in the medial-sternal area have enlarged and are pressing on the Esophagus and trachea(?). This may be the impact he feels when he tries to eat/drink per report. Pt is alert, follows commands.    Assessment / Plan / Recommendation Clinical Impression  Pt appears to present w/ severe  pharyngeal-Esophageal phase dysphagia resulting in potential for aspiration of any oral intake and regurgitated material from the Esophagus. Pt consumed ~6-7 small po trials of Single ice chips and sherbert total w/ no immediate, overt s/s of aspiration noted. Pt's raspy vocal quality was clear(not wet) and breathing remained at his baseline in apparent effort. However, w/in ~4-5 minutes post trials, pt began to c/o Esophageal-abdominal discomfort(min) then belching and throat clearing. The belching was followed by a wetness noise in his throat followed by severe coughing effort and increased respiratory effort. The cough attempts yielded little result d/t the (apparent) ineffective vocal cord movement and contact for strong, complete coughing - pt is NOT able to safely protect his airway it appears. Further po trials were not given, however, pt stated he desires "just a little sometimes". Unsure if pt's issue of lymph nodes "pressing" on his medial-sternal area is impacting Esophageal motility, respirations. Pt does not appear safe for oral intake at this time and is at increased risk for decline in Pulmonary status w/ any aspiration during OR after the swallow from regurgitated material not passing into/through the Esophagus. Pt is also at High risk for NOT meeting nutrition/hydrtion needs adequately - pt has already lost ~30 lbs and appears quite weak. ST services recommends NPO status at this time including any PO medications; recommended pt/family talk w/ MD re: any pleasure po's of ice chips to wet his mouth. ST educated and demonstrated use of oral swabs for cleaning and moistening the mouth; also discussed positioning pt more upright in bed to aid breathing. ST services recommends discussion of Alternative Means of Feeding at this time for nutrition/hydration  needs d/t the inability to safely meet his needs orally. This was briefly discussed w/ children of pt in room; daughter stated the Wife has Cognitive  decline and is easily anxious. NSG and MD updated.  SLP Visit Diagnosis: Dysphagia, pharyngoesophageal phase (R13.14)    Aspiration Risk  Severe aspiration risk;Risk for inadequate nutrition/hydration    Diet Recommendation  NPO w/ frequent oral care for hygiene and stimulation of swallowing  Medication Administration: Via alternative means    Other  Recommendations Recommended Consults: Consider ENT evaluation;Consider GI evaluation (d/t vocal quality; d/t Esophageal motility) Oral Care Recommendations: Oral care QID;Patient independent with oral care;Staff/trained caregiver to provide oral care; noted Palliative Care consult in place  Follow up Recommendations  (TBD)      Frequency and Duration  TBD         Prognosis Prognosis for Safe Diet Advancement: Guarded Barriers to Reach Goals: Severity of deficits Barriers/Prognosis Comment: symptoms have progressed quickly per pt/family      Swallow Study   General Date of Onset: 11/19/2016 HPI: Pt is a 82 y.o. male with a known history of recently diagnosed lung cancer with widespread metastasis, hypertension, diabetes and neuropathy. 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 He has had severe abdominal pain, nausea, dysphagia, poor intake, weight loss of ~30 lbs recently and generalized weakness. He has very poor prognosis per MDs. Patient returned to cancer center clinic due to severe periumbilical abdominal pain, dysphagia and dehydration. He was scheduled to begin chemo on Friday but has been progressively getting weak and has been unable to swallow anything, even water and medications. Eating makes him nauseated and causes throat pain. He continues to have a chronic cough and shortness of breath. He has severe nausea without vomiting constantly. Pt appears weak and has stridor-like respirations at rest which increase w/ any exertion. Pt's vocal quality is significantly declined w/ much reduced  vocal cord contact for phonation or conversation; volume is minimal. Pt and family stated the MDs have told them that the lymph nodes in the medial-sternal area have enlarged and are pressing on the Esophagus and trachea(?). This may be the impact he feels when he tries to eat/drink per report. Pt is alert, follows commands.  Type of Study: Bedside Swallow Evaluation Previous Swallow Assessment: none Diet Prior to this Study: Regular;Thin liquids (however has been unable to eat/drink well for ~1 month) Temperature Spikes Noted:  (97.9;; wbc 11.4) Respiratory Status: Room air History of Recent Intubation: No Behavior/Cognition: Alert;Cooperative;Pleasant mood Oral Cavity Assessment: Dry Oral Care Completed by SLP: Recent completion by staff Oral Cavity - Dentition: Edentulous (not wearing plates) Vision: Functional for self-feeding Self-Feeding Abilities: Able to feed self;Needs set up Patient Positioning: Upright in bed (supported more forward w/ pillows for breathing aid) Baseline Vocal Quality: Breathy;Hoarse (dysphonia; easily SOB w/ exertions) Volitional Cough: Weak (poor vocal cord contact) Volitional Swallow: Able to elicit    Oral/Motor/Sensory Function Overall Oral Motor/Sensory Function: Within functional limits   Ice Chips Ice chips: Impaired Presentation: Spoon;Self Fed (1 chip at a time x6 trials total) Oral Phase Impairments:  (none) Oral Phase Functional Implications:  (none) Pharyngeal Phase Impairments: Cough - Delayed (x1 w/ 3rd-4th trial) Other Comments: much delayed belching and wet noise noted post 5-6 trials total followed by severe cough effort w/ little result d/t the ineffective vocal cord movement and contact for coughing - pt is not able to safely protect his airway it appears   Thin  Liquid Thin Liquid: Not tested    Nectar Thick Nectar Thick Liquid: Not tested   Honey Thick Honey Thick Liquid: Not tested   Puree Puree: Not tested   Solid   GO   Solid: Not  tested         Orinda Kenner, MS, CCC-SLP Kalim Kissel 12/18/2016,3:25 PM

## 2016-12-18 NOTE — Consult Note (Signed)
Consultation Note Date: 12/18/2016   Patient Name: Keith Chandler  DOB: [birth year]  MRN: [id number]  Age / Sex: 82 y.o., male  PCP: [facility] [place] Referring Physician: Vaughan Basta, *  Reason for Consultation: Establishing goals of care and Psychosocial/spiritual support  HPI/Patient Profile: 82 y.o. male  with past medical history of recent diagnosis of extensive lung adenocarcinoma stage IVB with mets to brain/liver/bone (awaiting palliative chemotherapy scheduled Jan 19, 2017 and rad onc to see), HTN, diabetes mellitus, neuropathy admitted on 12/11/2016 direct admit from cancer center d/t abd pain (LBM x 1 week ago), dysphagia (unable to swallow even water), nausea, along with severe dehydration. Unfortunately he has had an acute decline over a very short period of time. Palliative care consulted to assist with [place] with extensive cancer.   Clinical Assessment and Goals of Care: I met today with Keith Chandler and his son at bedside (and his other son's girlfriend also at bedside). They seemed surprised to see palliative care. I know they have just met with Dr. Mike Gip which I think was important for them to hear from oncology. They told me that they have been recommended for GI consult and was asking if GI would see them today - I told them that I highly doubt that. Reassured them that he is on IV fluids and we are keeping him hydrated. They had many questions regarding nutrition and intake and I educated on just hearing him wheeze I would think that any intake would not be safe and high risk of aspiration and I did not want him to start coughing and cause any more inflammation in his airway. They seem to understand why we are recommending NPO. I did gently explain that there are not good options and unfortunately even with chemo and radiation IF this is beneficial the benefits come over time and not  immediately but his swallowing and intake is an immediate problem. I explained that I would speak with his doctors, look at his symptoms and medicines, and I will come back tomorrow to discuss with them further.   Today my focus was introducing myself and building rapport. I did get the feeling they need to know from all the medical team that they have exhausted all efforts. Will plan to discuss more GOC and specifics with them tomorrow when hopefully more family is available. Emotional support provided.   Primary Decision Maker PATIENT    SUMMARY OF RECOMMENDATIONS   - I will see again tomorrow and advance conversation - They appear to be struggling with poor prognosis  Code Status/Advance Care Planning:  Full code - I did not address today (has been addressed a couple times already by other providers)   Symptom Management:   Nausea: Continue NPO along with prn Zofran. Not really with vomiting so no place for agent to decrease gastric secretions. Abd very soft with trickle sounds.   Abd pain: Would recommend considering increase in steroids dose dexamethasone 8 mg daily. Would also consider opioid such as fentanyl 12.5-25 mcg every 2  hours prn given he is opioid naive. LBM x 1 week - glycerin suppository.   Poor intake: Consider r/u gastric outlet obstruction - stent? Unsure if he could even tolerate a procedure at this point I would worry. Unfortunately not safe for any intake and even as a comfort measure this could cause more discomfort than comfort to him most likely. No good answers here.    Palliative Prophylaxis:   Aspiration, Bowel Regimen, Delirium Protocol, Frequent Pain Assessment and Oral Care  Additional Recommendations (Limitations, Scope, Preferences):  Full Scope Treatment  Psycho-social/Spiritual:   Desire for further Chaplaincy support:yes  Additional Recommendations: Caregiving  Support/Resources, Education on Hospice and Grief/Bereavement  Support  Prognosis:   Unfortunately with no tolerance for po intake and likely not reversible prognosis very poor and would be eligible for even hospice facility - I did not share this with patient and family.   Discharge Planning: To Be Determined      Primary Diagnoses: Present on Admission: **None**   I have reviewed the medical record, interviewed the patient and family, and examined the patient. The following aspects are pertinent.  Past Medical History:  Diagnosis Date  . Diabetes mellitus without complication ([place])   . Hypertension   . Neuropathy    Social History   Social History  . Marital status: Married    Spouse name: N/A  . Number of children: N/A  . Years of education: N/A   Social History Main Topics  . Smoking status: Former Smoker    Packs/day: 2.00    Years: 45.00    Types: Cigarettes  . Smokeless tobacco: Former Systems developer    Quit date: 05/21/1992  . Alcohol use No  . Drug use: No  . Sexual activity: Not Asked   Other Topics Concern  . None   Social History Narrative  . None   Family History  Problem Relation Age of Onset  . Family history unknown: Yes   Scheduled Meds: . aspirin EC  81 mg Oral Daily  . dexamethasone  4 mg Oral Daily  . docusate sodium  100 mg Oral BID  . fentaNYL  25 mcg Transdermal Q72H  . folic acid  1 mg Oral Daily  . heparin  5,000 Units Subcutaneous Q8H  . insulin aspart  0-5 Units Subcutaneous QHS  . insulin aspart  0-9 Units Subcutaneous TID WC  . megestrol  40 mg Oral Daily  . pantoprazole  40 mg Oral Daily   Continuous Infusions: . 0.9 % NaCl with KCl 20 mEq / L 75 mL/hr at 12/18/16 0520   PRN Meds:.acetaminophen **OR** acetaminophen, albuterol, bisacodyl, HYDROcodone-acetaminophen, ketorolac, ondansetron **OR** ondansetron (ZOFRAN) IV, prochlorperazine, senna-docusate, simethicone, sodium phosphate Allergies  Allergen Reactions  . Sulfa Antibiotics    Review of Systems  Constitutional: Positive for  appetite change, fatigue and unexpected weight change.  Respiratory: Positive for shortness of breath.   Gastrointestinal: Positive for abdominal pain and nausea.  Neurological: Positive for weakness.    Physical Exam  Constitutional: He is oriented to person, place, and time. He appears well-developed.  HENT:  Head: Normocephalic.  Cardiovascular: Normal rate and regular rhythm.   Pulmonary/Chest: No accessory muscle usage. No tachypnea. He is in respiratory distress. He has decreased breath sounds. He has wheezes.  Prolonged inspiratory wheezing  Abdominal: Soft. Normal appearance. There is no tenderness.  Trickle BS x 4 quadrants, pain but not to palpation  Neurological: He is alert and oriented to person, place, and time.  Nursing note and  vitals reviewed.   Vital Signs: BP 137/70 (BP Location: Left Arm)   Pulse 90   Temp 97.9 F (36.6 C) (Oral)   Resp 20   Ht 5' 6"  (1.676 m)   Wt 82.3 kg (181 lb 8 oz)   SpO2 92%   BMI 29.29 kg/m  Pain Assessment: 0-10   Pain Score: 8    SpO2: SpO2: 92 % O2 Device:SpO2: 92 % O2 Flow Rate: .   IO: Intake/output summary:  Intake/Output Summary (Last 24 hours) at 12/18/16 1029 Last data filed at 12/18/16 0335  Gross per 24 hour  Intake              895 ml  Output                0 ml  Net              895 ml    LBM: Last BM Date: 12/10/16 Baseline Weight: Weight: 82.3 kg (181 lb 8 oz) Most recent weight: Weight: 82.3 kg (181 lb 8 oz)     Palliative Assessment/Data: 20%     Time Total: 18mn  Greater than 50%  of this time was spent counseling and coordinating care related to the above assessment and plan.  Signed by: AVinie Sill NP Palliative Medicine Team Pager # [phone](M-F 8a-5p) Team Phone # [phone](Nights/Weekends)

## 2016-12-18 NOTE — Consult Note (Signed)
[facility]  Date of admission:  12/16/2016  Inpatient day:  12/18/2016  Consulting physician: Dr. Demetrios Loll   Reason for Consultation:  Lung cancer stage IVB  Chief Complaint: Keith Chandler is a 82 y.o. male with widely metastatic adenocarcinoma of the lung who was admitted with general debilitation and poor oral intake  HPI:  The patient states that he was well until 10/2016 when he presented with hoarseness. He then developed a cough and congestion. He has been short of breath. He has lost 30 pounds in 6-8 weeks.   He states that if he eats or drinks anything, he has abdominal bloating and then is unable to eat.  He has no energy. He's had a severe headache. He describes pain in his shoulders and hips. He is able to go from the bed to the recliner and then sit at the table. Taking a shower makes him extremely out of breath. He can barely dress himself and go to the restroom.  PET scan on 12/12/2016 revealed extensive widespread metastasis involving the neck, mediastinum, hilar, porta hepatis, mesentery, retroperitoneum, adrenals, liver, subcutaneous no involvement and scattered osseous metastatic disease. There were small bilateral effusions, no pericardial effusions cardiomegaly and a moderate hiatal hernia.  Head MRI on 12/12/2016 revealed 3 subcentimeter (9 mm, 6 mm, and 3 mm) enhancing brain lesions consistent with metastatic disease. There was minimal edema. There was osseous metastasis in the clivus and C2.  He was seen by Dr. Grayland Ormond. Plan was for initiation of Alimta and Keytruda on 2017-01-17. He has been seen by radiation oncology. Per the family, he was to have had simulation today.  He requires 10 fractions of CNS radiation.   Past Medical History:  Diagnosis Date  . Diabetes mellitus without complication ([place])   . Hypertension   . Neuropathy     Past Surgical History:  Procedure Laterality Date  . CATARACT EXTRACTION, BILATERAL      Family  History  Problem Relation Age of Onset  . Family history unknown: Yes    Social History:  reports that he has quit smoking. His smoking use included Cigarettes. He has a 90.00 pack-year smoking history. He quit smokeless tobacco use about 24 years ago. He reports that he does not drink alcohol or use drugs.  He is accompanied by his son, daughter and wife.  Allergies:  Allergies  Allergen Reactions  . Sulfa Antibiotics     Medications Prior to Admission  Medication Sig Dispense Refill  . aspirin EC 81 MG tablet Take 81 mg by mouth daily.    Marland Kitchen atorvastatin (LIPITOR) 10 MG tablet Take 10 mg by mouth daily.    Marland Kitchen dexamethasone (DECADRON) 4 MG tablet Take 1 tablet (4 mg total) by mouth daily. 30 tablet 0  . fentaNYL (DURAGESIC - DOSED MCG/HR) 25 MCG/HR patch Place 1 patch (25 mcg total) onto the skin every 3 (three) days. 10 patch 0  . folic acid (FOLVITE) 1 MG tablet Take 1 tablet (1 mg total) by mouth daily. 90 tablet 3  . gabapentin (NEURONTIN) 600 MG tablet Take 600 mg by mouth 3 (three) times daily as needed.    Marland Kitchen HYDROcodone-acetaminophen (NORCO) 5-325 MG tablet Take 1 tablet by mouth every 6 (six) hours as needed for moderate pain. 30 tablet 0  . lidocaine-prilocaine (EMLA) cream Apply to affected area once 30 g 3  . megestrol (MEGACE) 40 MG tablet Take 1 tablet (40 mg total) by mouth daily. 30 tablet 1  .  omeprazole (PRILOSEC) 20 MG capsule Take 1 capsule (20 mg total) by mouth 2 (two) times daily before a meal. 60 capsule 1  . ondansetron (ZOFRAN) 8 MG tablet Take 1 tablet (8 mg total) by mouth every 8 (eight) hours as needed. 60 tablet 2  . prochlorperazine (COMPAZINE) 10 MG tablet Take 1 tablet (10 mg total) by mouth every 6 (six) hours as needed (Nausea or vomiting). 60 tablet 2    Review of Systems: GENERAL:  Fatigue.  No energy.  No fevers or sweats.  Weight loss of 30 pounds in 6-8 weeks. PERFORMANCE STATUS (ECOG):  3 HEENT:  Hoarse.  No visual changes, runny nose, sore  throat, mouth sores or tenderness. Lungs: Shortness of breath.  Cough.  Congestion.  No hemoptysis. Cardiac:  No chest pain, palpitations, orthopnea, or PND. GI:  Abdominal bloating if eats or drinks.  Nausea, vomiting.  No stool.  melena or hematochezia. GU:  No urgency, frequency, dysuria, or hematuria. Musculoskeletal:  Shoulder and hip pain.  No muscle tenderness. Extremities:  No pain or swelling. Skin:  No rashes or skin changes. Neuro:  Headache.  General weakness.  Diabetic neuropathy.  No focal weakness, balance or coordination issues. Endocrine:  No diabetes, thyroid issues, hot flashes or night sweats. Psych:  No mood changes, depression or anxiety. Pain:  No focal pain. Review of systems:  All other systems reviewed and found to be negative.  Physical Exam:  Blood pressure 140/76, pulse 87, temperature 97.9 F (36.6 C), temperature source Oral, resp. rate 20, height _0  (1.676 m), weight 181 lb 8 oz (82.3 kg), SpO2 91 %.  GENERAL:  Elderly gentleman lying comfortably on the medical unit in no acute distress. MENTAL STATUS:  Alert and oriented to person, place and time. HEAD:  Pearline Cables crewcut.  Normocephalic, atraumatic, face symmetric, no Cushingoid features. EYES:  Brown eyes.  Pupils equal round and reactive to light and accomodation.  No conjunctivitis or scleral icterus. ENT:  Hoarse.  Oropharynx clear without lesion.  Edentulous.  Tongue normal. Mucous membranes moist.  RESPIRATORY:  Clear to auscultation without rales, wheezes or rhonchi. CARDIOVASCULAR:  Regular rate and rhythm without murmur, rub or gallop. ABDOMEN:  Soft, non-tender, with active bowel sounds, and no hepatosplenomegaly.  No masses. SKIN:  Tan.  No rashes, ulcers or lesions. EXTREMITIES: No edema, no skin discoloration or tenderness.  No palpable cords. LYMPH NODES: Large right supraclavicular adenopathy.  No palpable cervical, axillary or inguinal adenopathy  NEUROLOGICAL: Unremarkable. PSYCH:   Appropriate.   Results for orders placed or performed during the hospital encounter of 12/01/2016 (from the past 48 hour(s))  Magnesium     Status: Abnormal   Collection Time: 11/23/2016  3:25 PM  Result Value Ref Range   Magnesium 2.7 (H) 1.7 - 2.4 mg/dL  Phosphorus     Status: None   Collection Time: 12/01/2016  3:25 PM  Result Value Ref Range   Phosphorus 4.6 2.5 - 4.6 mg/dL  Glucose, capillary     Status: Abnormal   Collection Time: 12/10/2016  4:31 PM  Result Value Ref Range   Glucose-Capillary 146 (H) 65 - 99 mg/dL  Glucose, capillary     Status: Abnormal   Collection Time: 12/10/2016  8:47 PM  Result Value Ref Range   Glucose-Capillary 123 (H) 65 - 99 mg/dL  Basic metabolic panel     Status: Abnormal   Collection Time: 12/18/16  4:45 AM  Result Value Ref Range   Sodium 146 (H) 135 -  145 mmol/L   Potassium 3.7 3.5 - 5.1 mmol/L   Chloride 115 (H) 101 - 111 mmol/L   CO2 24 22 - 32 mmol/L   Glucose, Bld 128 (H) 65 - 99 mg/dL   BUN 33 (H) 6 - 20 mg/dL   Creatinine, Ser 1.21 0.61 - 1.24 mg/dL   Calcium 8.1 (L) 8.9 - 10.3 mg/dL   GFR calc non Af Amer 55 (L) >60 mL/min   GFR calc Af Amer >60 >60 mL/min    Comment: (NOTE) The eGFR has been calculated using the CKD EPI equation. This calculation has not been validated in all clinical situations. eGFR's persistently <60 mL/min signify possible Chronic Kidney Disease.    Anion gap 7 5 - 15  Glucose, capillary     Status: Abnormal   Collection Time: 12/18/16  7:27 AM  Result Value Ref Range   Glucose-Capillary 126 (H) 65 - 99 mg/dL  Glucose, capillary     Status: Abnormal   Collection Time: 12/18/16  8:57 AM  Result Value Ref Range   Glucose-Capillary 136 (H) 65 - 99 mg/dL  Glucose, capillary     Status: Abnormal   Collection Time: 12/18/16 12:06 PM  Result Value Ref Range   Glucose-Capillary 118 (H) 65 - 99 mg/dL  Glucose, capillary     Status: Abnormal   Collection Time: 12/18/16  4:49 PM  Result Value Ref Range    Glucose-Capillary 112 (H) 65 - 99 mg/dL   US Abdomen Complete  Result Date: 11/27/2016 CLINICAL DATA:  Abdominal pain and belching since this morning. History of metastatic lung cancer. EXAM: ABDOMEN ULTRASOUND COMPLETE COMPARISON:  PET-CT 12/12/2016. FINDINGS: Gallbladder: Gallbladder sludge without gallstones or wall thickening visualized. No sonographic Murphy sign noted by sonographer. Common bile duct: Diameter: 5 mm, within normal limits. Liver: Normal background parenchymal echogenicity. 2 of the known liver metastases were visualized on this ultrasound, the larger measuring 1.4 cm. Slight central intrahepatic biliary dilatation. IVC: No abnormality visualized. Pancreas: Obscured by bowel gas. Spleen: Size and appearance within normal limits. Right Kidney: Length: 10.1 cm. Echogenicity within normal limits. No mass or hydronephrosis visualized. Left Kidney: Length: 11.3 cm. Echogenicity within normal limits. No hydronephrosis. Two cysts measure 4.5 cm and 1.5 cm. Abdominal aorta: No aneurysm in the proximal abdominal aorta. Distal aorta and bifurcation were obscured by bowel gas. Other findings: Partially visualized right pleural effusion. Trace perihepatic free fluid. IMPRESSION: 1. Gallbladder sludge. No gallstones or evidence of acute cholecystitis. 2. Small liver masses consistent with known metastases. 3. No hydronephrosis. 4. Right pleural effusion.  Trace ascites. Electronically Signed   By: Logan Bores M.D.   On: 12/03/2016 17:42    Assessment:  The patient is a 82 y.o. gentleman with widely metastatic adenocarcinoma of the lung who was admitted with declining functional status secondary to decreased oral intake.  He has been hoarse since diagnosis likely secondary to recurrent laryngeal nerve involvement.  By his history, he appears to swallow without difficulty, but feels bloated then can not eat or drink.  PET scan on 12/12/2016 revealed extensive widespread metastasis involving the neck,  mediastinum, hilar, porta hepatis, mesentery, retroperitoneum, adrenals, liver, subcutaneous no involvement and scattered osseous metastatic disease. There were small bilateral effusions, no pericardial effusions cardiomegaly and a moderate hiatal hernia.  Head MRI on 12/12/2016 revealed 3 subcentimeter (9 mm, 6 mm, and 3 mm) enhancing brain lesions consistent with metastatic disease. There was minimal edema. There was osseous metastasis in the clivus and C2.  Plan:  1.  Oncology:  Patient previously discussed with Dr. Grayland Ormond plans for Alimta and Beryle Flock starting on 18-Jan-2017.  We discussed that treatment is palliative.  He has also met with radiation oncology regarding cranial radiation.  Patient's performance status is poor.   We discussed code status issues.  Given his age and widely metastatic disease, I encouraged him and his family to talk further about heroic interventions.  He definitively stated that he did not want a feeding tube.  Discussed plan for swallowing study.  Given his poor tolerance for anything by mouth causing bloating, consider UGI study.  Thank you for allowing me to participate in GREYDEN BESECKER 's care.  I will follow him closely with you while hospitalized until Dr. Gary Fleet return.   Lequita Asal, MD  12/18/2016, 5:56 PM

## 2016-12-18 NOTE — Telephone Encounter (Signed)
Per Harlon Ditty, he spoke with The RN taking care of this patient and he is to weak and unable to lie flat on the table for 15 minutes to complete the simulation.  Staff will check with the RN and the patient on Thursday to see if he will be able to come to Radiation Oncology for his Simulation at that time.

## 2016-12-18 NOTE — Progress Notes (Signed)
[facility] [place] NAME: Keith Chandler    MR#:  [id number]  DATE OF BIRTH:  [birth year]  SUBJECTIVE:  CHIEF COMPLAINT:  No chief complaint on file.   The patient was recently diagnosed with metastatic lung cancer- metastases to brain, bones, liver, mediastinal lymph nodes- has difficulty swallowing food which is progressively getting worse up to the point that he cannot swallow liquids now.  REVIEW OF SYSTEMS:  CONSTITUTIONAL: No fever,positive for fatigue or weakness.  EYES: No blurred or double vision.  EARS, NOSE, AND THROAT: No tinnitus or ear pain.  RESPIRATORY: No cough, shortness of breath, wheezing or hemoptysis.  CARDIOVASCULAR: No chest pain, orthopnea, edema.  GASTROINTESTINAL: No nausea, vomiting, diarrhea or abdominal pain.  GENITOURINARY: No dysuria, hematuria.  ENDOCRINE: No polyuria, nocturia,  HEMATOLOGY: No anemia, easy bruising or bleeding SKIN: No rash or lesion. MUSCULOSKELETAL: No joint pain or arthritis.   NEUROLOGIC: No tingling, numbness, weakness.  PSYCHIATRY: No anxiety or depression.   ROS  DRUG ALLERGIES:   Allergies  Allergen Reactions  . Sulfa Antibiotics     VITALS:  Blood pressure 140/76, pulse 87, temperature 97.9 F (36.6 C), temperature source Oral, resp. rate 20, height 5\' 6"  (1.676 m), weight 82.3 kg (181 lb 8 oz), SpO2 91 %.  PHYSICAL EXAMINATION:  GENERAL:  82 y.o.-year-old patient lying in the bed with no acute distress.  EYES: Pupils equal, round, reactive to light and accommodation. No scleral icterus. Extraocular muscles intact.  HEENT: Head atraumatic, normocephalic. Oropharynx and nasopharynx clear.  NECK:  Supple, no jugular venous distention. No thyroid enlargement, no tenderness.  LUNGS: Normal breath sounds bilaterally, no wheezing, some crepitation. No use of accessory muscles of respiration.  CARDIOVASCULAR: S1, S2 normal. No murmurs, rubs, or gallops.  ABDOMEN: Soft, nontender,  nondistended. Bowel sounds present. No organomegaly or mass.  EXTREMITIES: No pedal edema, cyanosis, or clubbing.  NEUROLOGIC: Cranial nerves II through XII are intact. Muscle strength 5/5 in all extremities. Sensation intact. Gait not checked.  PSYCHIATRIC: The patient is alert and oriented x 3.  SKIN: No obvious rash, lesion, or ulcer.   Physical Exam LABORATORY PANEL:   CBC  Recent Labs Lab 11/24/2016 1120  WBC 11.4*  HGB 8.9*  HCT 26.7*  PLT 304   ------------------------------------------------------------------------------------------------------------------  Chemistries   Recent Labs Lab 12/10/2016 1120 11/25/2016 1525 12/18/16 0445  NA 141  --  146*  K 3.6  --  3.7  CL 108  --  115*  CO2 22  --  24  GLUCOSE 167*  --  128*  BUN 34*  --  33*  CREATININE 1.40*  --  1.21  CALCIUM 8.7*  --  8.1*  MG  --  2.7*  --   AST 20  --   --   ALT 14*  --   --   ALKPHOS 59  --   --   BILITOT 0.6  --   --    ------------------------------------------------------------------------------------------------------------------  Cardiac Enzymes No results for input(s): TROPONINI in the last 168 hours. ------------------------------------------------------------------------------------------------------------------  RADIOLOGY:  US Abdomen Complete  Result Date: 12/09/2016 CLINICAL DATA:  Abdominal pain and belching since this morning. History of metastatic lung cancer. EXAM: ABDOMEN ULTRASOUND COMPLETE COMPARISON:  PET-CT 12/12/2016. FINDINGS: Gallbladder: Gallbladder sludge without gallstones or wall thickening visualized. No sonographic Murphy sign noted by sonographer. Common bile duct: Diameter: 5 mm, within normal limits. Liver: Normal background parenchymal echogenicity. 2 of the known liver metastases were visualized on  this ultrasound, the larger measuring 1.4 cm. Slight central intrahepatic biliary dilatation. IVC: No abnormality visualized. Pancreas: Obscured by bowel gas.  Spleen: Size and appearance within normal limits. Right Kidney: Length: 10.1 cm. Echogenicity within normal limits. No mass or hydronephrosis visualized. Left Kidney: Length: 11.3 cm. Echogenicity within normal limits. No hydronephrosis. Two cysts measure 4.5 cm and 1.5 cm. Abdominal aorta: No aneurysm in the proximal abdominal aorta. Distal aorta and bifurcation were obscured by bowel gas. Other findings: Partially visualized right pleural effusion. Trace perihepatic free fluid. IMPRESSION: 1. Gallbladder sludge. No gallstones or evidence of acute cholecystitis. 2. Small liver masses consistent with known metastases. 3. No hydronephrosis. 4. Right pleural effusion.  Trace ascites. Electronically Signed   By: Logan Bores M.D.   On: 12/01/2016 17:42    ASSESSMENT AND PLAN:   Active Problems:   Dysphagia  * Dysphagia, poor oral intake and hoarse voice due to Lung cancer stage IV B The patient is admitted directly from radiation clinic to the medical floor. Keep nothing by mouth except medications, speech study, IV fluid support, Zofran when necessary, oncology consult.  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  Due to terminal cancer I was also called. He consult to address the goals of treatment and give idea to family and patient about his prognosis.  * Abdominal pain. Possible due to metastases, ultrasound of abdomen- without any new findings.  * Lung cancer stage IV B, with bone and brain metastasis.  Continue dexamethasone, oncology consult and palliative care consult.  * Acute renal failure with dehydration. IV fluid support and follow BMP.  * Diabetes. Start sliding scale.  * Iron deficiency anemia: Will give 2 doses of IV Feraheme over the next several weeks per radiation oncologist.   All the records are reviewed and case discussed with Care Management/Social Workerr. Management plans discussed with the patient, family and they are in agreement.  CODE STATUS:  Full.  TOTAL TIME TAKING CARE OF THIS PATIENT: 35 minutes.    POSSIBLE D/C IN 2-3 DAYS, DEPENDING ON CLINICAL CONDITION.   Vaughan Basta M.D on 12/18/2016   Between 7am to 6pm - Pager - [phone]  After 6pm go to [email or web address] - password [email or web address] [facility]  Office  [phone]  CC: Primary care physician; Center, [facility]  Note: This dictation was prepared with Dragon dictation along with smaller phrase technology. Any transcriptional errors that result from this process are unintentional.

## 2016-12-19 ENCOUNTER — Inpatient Hospital Stay: Payer: Medicare Other

## 2016-12-19 DIAGNOSIS — Z515 Encounter for palliative care: Secondary | ICD-10-CM

## 2016-12-19 DIAGNOSIS — Z7189 Other specified counseling: Secondary | ICD-10-CM

## 2016-12-19 DIAGNOSIS — R638 Other symptoms and signs concerning food and fluid intake: Secondary | ICD-10-CM

## 2016-12-19 DIAGNOSIS — R109 Unspecified abdominal pain: Secondary | ICD-10-CM

## 2016-12-19 DIAGNOSIS — C3490 Malignant neoplasm of unspecified part of unspecified bronchus or lung: Secondary | ICD-10-CM

## 2016-12-19 DIAGNOSIS — R1310 Dysphagia, unspecified: Secondary | ICD-10-CM

## 2016-12-19 LAB — GLUCOSE, CAPILLARY
GLUCOSE-CAPILLARY: 136 mg/dL — AB (ref 65–99)
Glucose-Capillary: 134 mg/dL — ABNORMAL HIGH (ref 65–99)
Glucose-Capillary: 157 mg/dL — ABNORMAL HIGH (ref 65–99)
Glucose-Capillary: 146 mg/dL — ABNORMAL HIGH (ref 65–99)

## 2016-12-19 MED ORDER — PROCHLORPERAZINE EDISYLATE 5 MG/ML IJ SOLN
5.0000 mg | Freq: Four times a day (QID) | INTRAMUSCULAR | Status: DC | PRN
  Filled 2016-12-19: qty 1

## 2016-12-19 MED ORDER — DEXAMETHASONE SODIUM PHOSPHATE 4 MG/ML IJ SOLN
8.0000 mg | INTRAMUSCULAR | Status: DC
  Administered 2016-12-19 – 2016-12-20 (×2): 8 mg via INTRAVENOUS
  Filled 2016-12-19 (×2): qty 2

## 2016-12-19 MED ORDER — HYDROMORPHONE HCL 1 MG/ML IJ SOLN: 0.5000 mg | INTRAMUSCULAR | Status: DC | PRN

## 2016-12-19 MED ORDER — BISACODYL 10 MG RE SUPP: 10.0000 mg | Freq: Every day | RECTAL | Status: DC | PRN

## 2016-12-19 NOTE — Progress Notes (Signed)
[facility] responded to a referral from RN for Pt in 119. RN stated that the last 6 weeks have been difficult for the Pt. Cancer has spread and Pt is currently having difficulty swallowing/eating. Pt to undergo radiation if strength is sufficient. Feeding tube has at this point been rejected by the Pt. Pt is asleep during my visit and was more introductory with family (Wife, Daughter, Son) [facility] informed of [facility] spiritual care and assured our availability 24/7. [facility] is available for follow up as needed.    12/19/16 1100  Clinical Encounter Type  Visited With Patient;Patient and family together;Health care provider  Visit Type Initial;Spiritual support  Referral From Nurse  Consult/Referral To Chaplain  Spiritual Encounters  Spiritual Needs Prayer

## 2016-12-19 NOTE — Progress Notes (Signed)
Daily Progress Note   Patient Name: DOREAN DANIELLO       Date: 12/19/2016 DOB: 07/24/1934  Age: 82 y.o. MRN#: [id number] Attending Physician: Vaughan Basta, * Primary Care Physician: [facility] Date: 12/01/2016  Reason for Consultation/Follow-up: Establishing goals of care and Psychosocial/spiritual support  Subjective: Mr. Schetter continues to worsen daily. Son at bedside says he had a difficult night.   Length of Stay: 2  Current Medications: Scheduled Meds:  . aspirin  150 mg Rectal Daily  . dexamethasone  8 mg Intravenous Q24H  . fentaNYL  25 mcg Transdermal Q72H  . heparin  5,000 Units Subcutaneous Q8H  . insulin aspart  0-5 Units Subcutaneous QHS  . insulin aspart  0-9 Units Subcutaneous TID WC  . pantoprazole (PROTONIX) IV  40 mg Intravenous Q24H    Continuous Infusions: . 0.9 % NaCl with KCl 20 mEq / L 75 mL/hr at 12/19/16 0800    PRN Meds: acetaminophen **OR** acetaminophen, albuterol, HYDROcodone-acetaminophen, HYDROmorphone (DILAUDID) injection, ketorolac, ondansetron **OR** ondansetron (ZOFRAN) IV, sodium phosphate  Physical Exam    Constitutional: He is oriented to person, place, and time. He appears well-developed.  HENT:  Head: Normocephalic.  Cardiovascular: Normal rate and regular rhythm.   Pulmonary/Chest: No accessory muscle usage. No tachypnea. He is in respiratory distress. He has decreased breath sounds. He has wheezes.  Prolonged inspiratory wheezing  Abdominal: Soft. Normal appearance. There is no tenderness.  Trickle BS x 4 quadrants, pain but not to palpation  Neurological: He is alert and oriented to person, place, and time.  Nursing note and vitals reviewed.     Vital Signs: BP 133/74 (BP Location: Left Arm)    Pulse (!) 102   Temp (!) 97.5 F (36.4 C) (Oral)   Resp (!) 22   Ht _0  (1.676 m)   Wt 82.3 kg (181 lb 8 oz)   SpO2 93%   BMI 29.29 kg/m  SpO2: SpO2: 93 % O2 Device: O2 Device: Nasal Cannula O2 Flow Rate: O2 Flow Rate (L/min): 2 L/min  Intake/output summary: No intake or output data in the 24 hours ending 12/19/16 1055 LBM: Last BM Date: 01/10/17 Baseline Weight: Weight: 82.3 kg (181 lb 8 oz) Most recent weight: Weight: 82.3 kg (181 lb 8 oz)       Palliative  Assessment/Data: 30%     Patient Active Problem List   Diagnosis Date Noted  . Adenocarcinoma of lung, stage 4 ([facility])   . Abdominal pain   . Poor fluid intake   . Palliative care encounter   . Dysphagia 11/23/2016  . Goals of care, counseling/discussion 12/15/2016  . Adenocarcinoma, lung, unspecified laterality ([place]) 12/12/2016  . Weakness 12/02/2016  . Benign essential HTN 11/29/2016  . Hyperlipidemia, mixed 11/29/2016  . Stable angina pectoris ([place]) 11/29/2016    Palliative Care Assessment & Plan   HPI: 82 y.o. male  with past medical history of recent diagnosis of extensive lung adenocarcinoma stage IVB with mets to brain/liver/bone (awaiting palliative chemotherapy scheduled 03-Jan-2017 and rad onc to see), HTN, diabetes mellitus, neuropathy admitted on 11/19/2016 direct admit from cancer center d/t abd pain (LBM x 1 week ago), dysphagia (unable to swallow even water), nausea, along with severe dehydration. Unfortunately he has had an acute decline over a very short period of time. Palliative care consulted to assist with [place] with extensive cancer.    Assessment: I met today initially with daughter, Helene Kelp. We spoke privately about her father's poor prognosis and the need to have a discussion as a family regarding some difficult decisions Mr. Cott is faced with. She seems to have very good understanding of his current situation and that this is EOL. She has contacted family who is on the way to meet with me.  I met  with sons Timmothy Sours and [place], Don's wife Colletta [place], daughter Helene Kelp, wife and her brother as well as Mr. Eschmann. I explained to them that we are unfortunately in a difficult position where there does not seem to be any good options (perhaps no options at all) to reverse his situation which seems to be worsening daily. I explained that I fear his prognosis is very poor. I told Mr. Rosenfield that we wish to honor and support him in any way that we are able. I also asked his help in thinking if his time is limited how he would want to spend that time and what is his priority for [place] to focus on. He began to get very SOB and likely anxious from our conversation so I spent some time assisting him in calming down.  I spoke more with children outside room - discussed artificial feeding (he does not want feeding tube nor would this be helpful as he has abd pain/nausea with even ice chips). TPN was mentioned and I explained that sometimes this is done but it will not help his breathing, improve his QOL, or make him stronger and I do not believe this will change the outcome. They have requested GI consult as this was brought up to them yesterday. They want to hear from all the medical team that everything has been done so they can have final acceptance.   Late entry: I came back later and spoke more with family. Mr. Trulock is distressed from his barium swallow with nausea and abd pain - encourged and discussed prn medications. I also explained his high risk of aspiration with any and all amounts of intake per his study (not surprising to me - earlier he coughed with a dribble of water from straw I gave). Have discussed risks of intake but he seems to desire at times for comfort. Family seems more accepting that he is at EOL. At this point they are awaiting GI input and really waiting for Mr. Matus to voice his wishes. I will follow up with  them in the morning.   Recommendations/Plan:  Nausea: Continue NPO along with prn Zofran.  Added prn compazine if zofran ineffective. Would recommend consideration of Reglan.   Abd pain: Increase in steroids dose dexamethasone 8 mg daily - continue protonix IV. Agree with dilaudid 0.5 mg IV every 3 hours prn (may use for SOB as well). Dulcolax supp daily prn.   Poor intake: They desire GI consult just to have all their information. They want to make sure that they have truly exhausted all options to improve quantity and quality of life.    Goals of Care and Additional Recommendations:  Limitations on Scope of Treatment: He is considering his options (although options are very poor)  Code Status:  Full code - strongly recommended DNR  Prognosis:   < 2 weeks  Discharge Planning:  To Be Determined  Care plan was discussed with Dr. Karie Mainland RN  Thank you for allowing the Palliative Medicine Team to assist in the care of this patient.   Total Time 1050-1200, 1540-1600 28mn Prolonged Time Billed  yes       Greater than 50%  of this time was spent counseling and coordinating care related to the above assessment and plan.  AVinie Sill NP Palliative Medicine Team Pager # [phone](M-F 8a-5p) Team Phone # [phone](Nights/Weekends)

## 2016-12-19 NOTE — Progress Notes (Signed)
SLP Cancellation Note  Patient Details Name: Keith Chandler MRN: [id number] DOB: 1935-03-12   Cancelled treatment:       Reason Eval/Treat Not Completed: Medical issues which prohibited therapy (chart reviewed; holding pending medical team/pt poc)  Pt is meeting w/ Palliative Care and Oncology MD to further discuss pt's poc goals. Pt is recommended to remain NPO d/t risk for aspiration from Esophageal dysphagia; dysphagia; abdominal discomfort impacting potential regurgitation; Palliative Care NP is clarifying goals/wishes. Recommended frequent oral care for hygiene and comfort. ST services will be available as needed. Palliative Care/NSG agreed.    Keith Kenner, MS, CCC-SLP Keith Chandler 12/19/2016, 3:33 PM

## 2016-12-19 NOTE — Progress Notes (Signed)
[facility] [place] NAME: Keith Chandler    MR#:  [id number]  DATE OF BIRTH:  [birth year]  SUBJECTIVE:  CHIEF COMPLAINT:  No chief complaint on file.   The patient was recently diagnosed with metastatic lung cancer- metastases to brain, bones, liver, mediastinal lymph nodes- has difficulty swallowing food which is progressively getting worse up to the point that he cannot swallow liquids now.  REVIEW OF SYSTEMS:  CONSTITUTIONAL: No fever,positive for fatigue or weakness.  EYES: No blurred or double vision.  EARS, NOSE, AND THROAT: No tinnitus or ear pain.  RESPIRATORY: No cough, shortness of breath, wheezing or hemoptysis.  CARDIOVASCULAR: No chest pain, orthopnea, edema.  GASTROINTESTINAL: No nausea, vomiting, diarrhea or abdominal pain.  GENITOURINARY: No dysuria, hematuria.  ENDOCRINE: No polyuria, nocturia,  HEMATOLOGY: No anemia, easy bruising or bleeding SKIN: No rash or lesion. MUSCULOSKELETAL: No joint pain or arthritis.   NEUROLOGIC: No tingling, numbness, weakness.  PSYCHIATRY: No anxiety or depression.   ROS  DRUG ALLERGIES:   Allergies  Allergen Reactions  . Sulfa Antibiotics     VITALS:  Blood pressure 139/76, pulse (!) 114, temperature 98.4 F (36.9 C), temperature source Oral, resp. rate 19, height 5\' 6"  (1.676 m), weight 82.3 kg (181 lb 8 oz), SpO2 97 %.  PHYSICAL EXAMINATION:  GENERAL:  82 y.o.-year-old patient lying in the bed with no acute distress.  EYES: Pupils equal, round, reactive to light and accommodation. No scleral icterus. Extraocular muscles intact.  HEENT: Head atraumatic, normocephalic. Oropharynx and nasopharynx clear.  NECK:  Supple, no jugular venous distention. No thyroid enlargement, no tenderness.  LUNGS: Normal breath sounds bilaterally, no wheezing, some crepitation. No use of accessory muscles of respiration.  CARDIOVASCULAR: S1, S2 normal. No murmurs, rubs, or gallops.  ABDOMEN: Soft,  nontender, nondistended. Bowel sounds present. No organomegaly or mass.  EXTREMITIES: No pedal edema, cyanosis, or clubbing.  NEUROLOGIC: Cranial nerves II through XII are intact. Muscle strength 5/5 in all extremities. Sensation intact. Gait not checked.  PSYCHIATRIC: The patient is alert and oriented x 3.  SKIN: No obvious rash, lesion, or ulcer.   Physical Exam LABORATORY PANEL:   CBC  Recent Labs Lab 12/01/2016 1120  WBC 11.4*  HGB 8.9*  HCT 26.7*  PLT 304   ------------------------------------------------------------------------------------------------------------------  Chemistries   Recent Labs Lab 12/07/2016 1120 11/23/2016 1525 12/18/16 0445  NA 141  --  146*  K 3.6  --  3.7  CL 108  --  115*  CO2 22  --  24  GLUCOSE 167*  --  128*  BUN 34*  --  33*  CREATININE 1.40*  --  1.21  CALCIUM 8.7*  --  8.1*  MG  --  2.7*  --   AST 20  --   --   ALT 14*  --   --   ALKPHOS 59  --   --   BILITOT 0.6  --   --    ------------------------------------------------------------------------------------------------------------------  Cardiac Enzymes No results for input(s): TROPONINI in the last 168 hours. ------------------------------------------------------------------------------------------------------------------  RADIOLOGY:  Dg Esophagus  Result Date: 12/19/2016 CLINICAL DATA:  Dysphagia. EXAM: ESOPHOGRAM/BARIUM SWALLOW TECHNIQUE: Single contrast examination was performed using  thin barium. FLUOROSCOPY TIME:  Fluoroscopy Time:  2.6 minutes Radiation Exposure Index (if provided by the fluoroscopic device): 37.6 mGy COMPARISON: CT scan of the chest dated 12/12/2016 FINDINGS: The patient ingested several sips of thin barium. There is irregular narrowing of the esophagus just below thoracic  inlet consistent with the mediastinal tumor present on the prior CT scan. However, there is no obstruction. The patient has a large hiatal hernia. The patient silently aspirated a small  amount of thin barium with each swallow and therefore the study is limited. IMPRESSION: 1. Silent aspiration with each swallow of barium. 2. Irregular narrowing of the thoracic esophagus beginning just below the thoracic inlet. No severe stricture although the esophagus could not be distended because of the aspiration. Barium tablet was not attempted because of the aspiration. 3. Large hiatal hernia. Electronically Signed   By: Lorriane Shire M.D.   On: 12/19/2016 15:25    ASSESSMENT AND PLAN:   Active Problems:   Dysphagia   Adenocarcinoma of lung, stage 4 ([facility])   Abdominal pain   Poor fluid intake   Palliative care encounter  * Dysphagia, poor oral intake and hoarse voice due to Lung cancer stage IV B The patient is admitted directly from radiation clinic to the medical floor. Keep nothing by mouth except medications, speech study, IV fluid support, Zofran when necessary, oncology consult appreciated.  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  Due to terminal cancer GI was also called. They need to discuss the option for feeding and about his prognosis. Pt denies feeding tube.  * Abdominal pain. Possible due to metastases, ultrasound of abdomen- without any new findings.  * Lung cancer stage IV B, with bone and brain metastasis.  Continue dexamethasone, oncology consult and palliative care consult.  * Acute renal failure with dehydration. IV fluid support and follow BMP.  * Diabetes. Start sliding scale.  * Iron deficiency anemia:  IV Feraheme    All the records are reviewed and case discussed with Care Management/Social Workerr. Management plans discussed with the patient, family and they are in agreement.  CODE STATUS: Full.  TOTAL TIME TAKING CARE OF THIS PATIENT: 35 minutes.    POSSIBLE D/C IN 2-3 DAYS, DEPENDING ON CLINICAL CONDITION.   Vaughan Basta M.D on 12/19/2016   Between 7am to 6pm - Pager - [phone]  After 6pm go to [email or web address] -  password [email or web address] [facility]  Office  [phone]  CC: Primary care physician; [facility]  Note: This dictation was prepared with Dragon dictation along with smaller phrase technology. Any transcriptional errors that result from this process are unintentional.

## 2016-12-19 NOTE — Progress Notes (Signed)
Initial Nutrition Assessment  DOCUMENTATION CODES:   Severe malnutrition in context of acute illness/injury  INTERVENTION:  No appropriate nutrition interventions at this time. Patient is NPO per SLP recommendation in setting of severe dysphagia. From a nutrition perspective, if patient was to opt to continue full code/full scope of treatment tube feeding would be an appropriate intervention. However, patient has already discussed with Oncologist and PMT he does not want tube feeding. TPN not appropriate at this time (refusal of tube feeding is not indication for TPN).   Will continue to monitor discussions regarding goals of care.  NUTRITION DIAGNOSIS:   Malnutrition (Severe) related to acute illness (widely metastatic stage IV adenocarcinoma of lung) as evidenced by 6.9 percent weight loss over 1-1.5 months, mild depletion of body fat, moderate depletion of body fat, energy intake < or equal to 50% for > or equal to 5 days.  GOAL:   Other (Comment) (Patient will receive adequate hydration. Bites of comfort feeds per patient request.)  MONITOR:   Labs, Weight trends, I & O's  REASON FOR ASSESSMENT:   Malnutrition Screening Tool, Consult Assessment of nutrition requirement/status  ASSESSMENT:   82 year old male with PMHx of DM type 2, neuropathy, HTN, recently diagnosed stage IVB adenocarcinoma of the lung with widespread metastasis involving the neck, mediastinum, hilar, porta hepatis, mesentery, retroperitoneum, adrenals, liver, and brain. Patient admitted with abdominal pain, dysphagia, poor oral intake for 1 week.   -Patient was assessed by SLP on 7/30. Found to have severe pharyngeal-esophageal phase dysphagia with potential for aspiration of any oral intake and regurgitated material from esophagus. SLP recommends NPO. -Per Oncology notes plan was for initiation of Alimta and Keytruda on January 15, 2017. There was also plan for CNS XRT. Treatment is palliative. Patient stated to  Oncologist he does not want a feeding tube. -PMT following patient/family.  Spoke with patient and family members at bedside. Patient is now having difficulty speaking, most of history provided by family. They are known to this RD from recent admission. Family reports patient has had worsening of ability to swallow and speak over the last few weeks. They had purchased Ensure for patient to drink at home, but he has been having nausea and abdominal distention with intake of any food or liquids. Only tolerating sips PTA. Now NPO per SLP evaluation.  UBW had been 195-200 lbs. Patient lost 13.4 lbs (6.9% body weight) over 1 to 1.5 months, which is significant for time frame. Per chart weight was down to 172.1 lbs on 7/30, but may have been in setting of dehydration as current weight of 181.5 lbs is more in line with weight from 2 weeks ago.  Medications reviewed and include: Decadron 8 mg Q24hrs, Novolog 0-9 units TID, Novolog 0-5 units QHS, pantoprazole, NS with KCl 20 mEq/L @ 75 ml/hr.  Labs reviewed: CBG 112-136 past 24 hrs. On 7/31 Sodium 146, Chloride 115, BUN 33.   Nutrition-Focused physical exam completed. Findings are mild-moderate fat depletion, no muscle depletion, and no edema.   Discussed patient with PMT NP who is following patient. He does not want a feeding tube. No other nutrition interventions would be beneficial at this time.   Diet Order:  Diet NPO time specified  Skin:  Wound (see comment) (nodule behind left ear)  Last BM:  PTA (12/10/2016 per chart)  Height:   Ht Readings from Last 1 Encounters:  11/30/2016 5\' 6"  (1.676 m)    Weight:   Wt Readings from Last 1 Encounters:  11/26/2016  181 lb 8 oz (82.3 kg)    Ideal Body Weight:  64.5 kg  BMI:  Body mass index is 29.29 kg/m.  Estimated Nutritional Needs:   Kcal:  9611-6435 (MSJ x 1.3-1.5)  Protein:  100-115 grams (1.2-1.4 grams/kg)  Fluid:  2 L/day (25 ml/kg)  EDUCATION NEEDS:   Education needs no appropriate  at this time  Willey Blade, MS, RD, LDN Pager: [phone] After Hours Pager: [phone]

## 2016-12-19 DEATH — deceased

## 2016-12-20 ENCOUNTER — Encounter: Payer: Self-pay | Admitting: *Deleted

## 2016-12-20 DIAGNOSIS — G629 Polyneuropathy, unspecified: Secondary | ICD-10-CM

## 2016-12-20 DIAGNOSIS — E638 Other specified nutritional deficiencies: Secondary | ICD-10-CM

## 2016-12-20 DIAGNOSIS — R109 Unspecified abdominal pain: Secondary | ICD-10-CM

## 2016-12-20 DIAGNOSIS — K5900 Constipation, unspecified: Secondary | ICD-10-CM

## 2016-12-20 DIAGNOSIS — E119 Type 2 diabetes mellitus without complications: Secondary | ICD-10-CM

## 2016-12-20 LAB — GLUCOSE, CAPILLARY
GLUCOSE-CAPILLARY: 138 mg/dL — AB (ref 65–99)
GLUCOSE-CAPILLARY: 119 mg/dL — AB (ref 65–99)

## 2016-12-20 MED ORDER — MORPHINE SULFATE (CONCENTRATE) 10 MG/0.5ML PO SOLN: 10.0000 mg | ORAL | Status: DC | PRN

## 2016-12-20 MED ORDER — MORPHINE SULFATE (PF) 2 MG/ML IV SOLN
1.0000 mg | INTRAVENOUS | Status: DC | PRN
  Administered 2016-12-20 (×2): 2 mg via INTRAVENOUS
  Administered 2016-12-20: 4 mg via INTRAVENOUS
  Administered 2016-12-21 (×3): 2 mg via INTRAVENOUS
  Filled 2016-12-20 (×4): qty 1
  Filled 2016-12-20: qty 2

## 2016-12-20 MED ORDER — LORAZEPAM 2 MG/ML IJ SOLN
0.5000 mg | INTRAMUSCULAR | Status: DC | PRN
  Administered 2016-12-20 – 2016-12-21 (×3): 0.5 mg via INTRAVENOUS
  Filled 2016-12-20 (×3): qty 1

## 2016-12-20 MED ORDER — SODIUM CHLORIDE 0.9 % IV SOLN
2.0000 mg/h | INTRAVENOUS | Status: DC
  Administered 2016-12-20: 20:00:00 2 mg/h via INTRAVENOUS
  Filled 2016-12-20: qty 10

## 2016-12-20 MED ORDER — SODIUM CHLORIDE 0.9% FLUSH: 10.0000 mL | INTRAVENOUS | Status: DC | PRN

## 2016-12-20 MED ORDER — BISACODYL 10 MG RE SUPP: 10.0000 mg | Freq: Every day | RECTAL | Status: DC | PRN

## 2016-12-20 MED ORDER — HYDROMORPHONE HCL 1 MG/ML IJ SOLN
0.5000 mg | INTRAMUSCULAR | Status: DC | PRN
Start: 2016-12-20 — End: 2016-12-20

## 2016-12-20 MED ORDER — GLYCOPYRROLATE 0.2 MG/ML IJ SOLN
0.2000 mg | INTRAMUSCULAR | Status: DC | PRN
  Administered 2016-12-20 – 2016-12-21 (×2): 0.2 mg via INTRAVENOUS
  Filled 2016-12-20 (×3): qty 1

## 2016-12-20 NOTE — Progress Notes (Signed)
Hematology/Oncology Consult note [facility]  [phone] [phone]  Patient Care Team: Center, [facility] as PCP - General (General Practice)   Name of the patient: Keith Chandler  [id number]  [birth year]   Date of visit: 12/20/2016   Interval history- he is still in distress from sob. Unable to lie flat. No BM in  2weeks  ECOG PS- 3  Review of systems- Review of Systems  Constitutional: Positive for malaise/fatigue. Negative for chills, fever and weight loss.  HENT: Negative for congestion, ear discharge and nosebleeds.   Eyes: Negative for blurred vision.  Respiratory: Positive for shortness of breath. Negative for cough, hemoptysis, sputum production and wheezing.   Cardiovascular: Negative for chest pain, palpitations, orthopnea and claudication.  Gastrointestinal: Positive for constipation. Negative for abdominal pain, blood in stool, diarrhea, heartburn, melena, nausea and vomiting.       Difficulty swallowing  Genitourinary: Negative for dysuria, flank pain, frequency, hematuria and urgency.  Musculoskeletal: Negative for back pain, joint pain and myalgias.  Skin: Negative for rash.  Neurological: Positive for weakness. Negative for dizziness, tingling, focal weakness, seizures and headaches.  Endo/Heme/Allergies: Does not bruise/bleed easily.  Psychiatric/Behavioral: Negative for depression and suicidal ideas. The patient does not have insomnia.      Allergies  Allergen Reactions  . Sulfa Antibiotics      Past Medical History:  Diagnosis Date  . Diabetes mellitus without complication ([place])   . Hypertension   . Neuropathy      Past Surgical History:  Procedure Laterality Date  . CATARACT EXTRACTION, BILATERAL      Social History   Social History  . Marital status: Married    Spouse name: N/A  . Number of children: N/A  . Years of education: N/A   Occupational History  . Not on file.   Social  History Main Topics  . Smoking status: Former Smoker    Packs/day: 2.00    Years: 45.00    Types: Cigarettes  . Smokeless tobacco: Former Systems developer    Quit date: 05/21/1992  . Alcohol use No  . Drug use: No  . Sexual activity: Not on file   Other Topics Concern  . Not on file   Social History Narrative  . No narrative on file    Family History  Problem Relation Age of Onset  . Family history unknown: Yes     Current Facility-Administered Medications:  .  0.9 % NaCl with KCl 20 mEq/ L  infusion, , Intravenous, Continuous, Demetrios Loll, MD, Last Rate: 75 mL/hr at 12/20/16 1458 .  morphine CONCENTRATE 10 MG/0.5ML oral solution 10 mg, 10 mg, Oral, Q2H PRN, Vaughan Basta, MD .  [DISCONTINUED] ondansetron (ZOFRAN) tablet 4 mg, 4 mg, Oral, Q6H PRN **OR** ondansetron (ZOFRAN) injection 4 mg, 4 mg, Intravenous, Q6H PRN, Demetrios Loll, MD, 4 mg at 12/19/16 1408 .  prochlorperazine (COMPAZINE) injection 5 mg, 5 mg, Intravenous, L9J PRN, Vinie Sill C, NP .  sodium chloride flush (NS) 0.9 % injection 10-40 mL, 10-40 mL, Intracatheter, PRN, Lloyd Huger, MD  Physical exam:  Vitals:   12/18/16 2100 12/19/16 0436 12/19/16 2010 12/20/16 0702  BP:  133/74 139/76 103/84  Pulse:  (!) 102 (!) 114 96  Resp:  (!) 22 19 (!) 22  Temp:  (!) 97.5 F (36.4 C) 98.4 F (36.9 C) 97.6 F (36.4 C)  TempSrc:  Oral Oral Oral  SpO2: 91% 93% 97% 96%  Weight:  Height:       Physical Exam  Constitutional: He is oriented to person, place, and time.  Elderly gentleman who appears to be in distress due to SOB  HENT:  Head: Normocephalic and atraumatic.  Eyes: Pupils are equal, round, and reactive to light. EOM are normal.  Neck: Normal range of motion.  Cardiovascular: Normal rate, regular rhythm and normal heart sounds.   Pulmonary/Chest: Effort normal.  Breath sounds diminished b/l  Abdominal: Soft. Bowel sounds are normal.  Neurological: He is alert and oriented to person, place, and  time.  Skin: Skin is warm and dry.     CMP Latest Ref Rng & Units 12/18/2016  Glucose 65 - 99 mg/dL 128(H)  BUN 6 - 20 mg/dL 33(H)  Creatinine 0.61 - 1.24 mg/dL 1.21  Sodium 135 - 145 mmol/L 146(H)  Potassium 3.5 - 5.1 mmol/L 3.7  Chloride 101 - 111 mmol/L 115(H)  CO2 22 - 32 mmol/L 24  Calcium 8.9 - 10.3 mg/dL 8.1(L)  Total Protein 6.5 - 8.1 g/dL -  Total Bilirubin 0.3 - 1.2 mg/dL -  Alkaline Phos 38 - 126 U/L -  AST 15 - 41 U/L -  ALT 17 - 63 U/L -   CBC Latest Ref Rng & Units 11/25/2016  WBC 3.8 - 10.6 K/uL 11.4(H)  Hemoglobin 13.0 - 18.0 g/dL 8.9(L)  Hematocrit 40.0 - 52.0 % 26.7(L)  Platelets 150 - 440 K/uL 304    @IMAGES @  Dg Chest 2 View  Result Date: 12/02/2016 CLINICAL DATA:  Fatigue, weakness, chest pressure and cough EXAM: CHEST  2 VIEW COMPARISON:  None. FINDINGS: Mild cardiomegaly with vascular congestion. Chronic nonspecific interstitial prominence without definite edema. No significant effusion or pneumothorax. No focal pneumonia, collapse or consolidation. Trachea is midline. Atherosclerosis of aorta. Degenerative changes of the spine. No compression fracture. IMPRESSION: Cardiomegaly with vascular congestion Mild chronic interstitial changes suspected. No definite superimposed CHF or pneumonia. Electronically Signed   By: Jerilynn Mages.  Shick M.D.   On: 12/02/2016 12:51   Ct Chest W Contrast  Result Date: 12/02/2016 CLINICAL DATA:  Abnormal ultrasound showing liver masses, epigastric discomfort due to gas, lays, 15 pounds weight loss, loss of appetite, generalized weakness, type II diabetes mellitus, hypertension, former smoker EXAM: CT CHEST, ABDOMEN, AND PELVIS WITH CONTRAST TECHNIQUE: Multidetector CT imaging of the chest, abdomen and pelvis was performed following the standard protocol during bolus administration of intravenous contrast. Sagittal and coronal MPR images reconstructed from axial data set. CONTRAST:  11mL ISOVUE-300 IOPAMIDOL (ISOVUE-300) INJECTION 61% IV.  Dilute oral contrast. COMPARISON:  Abdominal ultrasound 12/02/2016 FINDINGS: CT CHEST FINDINGS Cardiovascular: Atherosclerotic calcifications aorta, coronary arteries, proximal great vessels. Aorta normal caliber. Small pericardial effusion. Mild LEFT ventricular dilatation. Displacement and compression of the IVC by mediastinal adenopathy the remains patent. Mediastinum/Nodes: Moderate-sized hiatal hernia question mild wall thickening of the midesophagus. Multiple enlarged mediastinal lymph nodes including a conglomerate nodal mass at the AP window 3.3 x 4.6 cm, pretracheal node 16 mm image 21, prevascular node 16 mm image 16, RIGHT superior mediastinal node 21 mm image 14, and necrotic subcarinal node 29 mm image 28. 17 mm RIGHT hilar node image 31. RIGHT superior hilar node 16 mm image 27. Adenopathy extends throughout the superior mediastinum on the RIGHT, displacing and compressing the IVC and extending to the base of the RIGHT cervical region. No axillary adenopathy. Unable to assess patency of the RIGHT subclavian vein. Lungs/Pleura: Small BILATERAL pleural effusions. Abnormal soft tissue/tumor surrounds the RIGHT and LEFT mainstem bronchi.  Underlying emphysematous changes. Dependent atelectasis in the posterior lower lobes. No definite pulmonary mass. 4 mm RIGHT upper lobe nodule image 33. 3 mm RIGHT upper lobe nodule image 63. Calcified granuloma anterior RIGHT lung. Few additional scattered tiny BILATERAL pulmonary nodules. 6 mm LEFT upper lobe nodule image 25. No infiltrate or pneumothorax. Musculoskeletal: No acute osseous lesions. CT ABDOMEN PELVIS FINDINGS Hepatobiliary: Subtle liver lesions question metastases including an 11 mm LEFT lobe lesion image 52, 9 mm RIGHT lobe lesion image 55, LEFT lobe lesion superiorly 8 mm image 50. Gallbladder unremarkable. Pancreas: Mild peripancreatic edema. Peripancreatic adenopathy. No discrete pancreatic mass or ductal dilatation. Spleen: Normal appearance  Adrenals/Urinary Tract: BILATERAL adrenal nodules 17 x 12 mm LEFT and 9 x 6 mm RIGHT. LEFT renal cysts largest 4.5 x 3.4 cm image 72. Kidneys, ureters, and bladder otherwise normal appearance. Stomach/Bowel: Scattered stool throughout colon. Sigmoid diverticulosis without evidence of diverticulitis. No definite gastric or bowel mass. Normal appendix. Vascular/Lymphatic: Atherosclerotic calcifications aorta without aneurysm. Extensive adenopathy and mesenteric up to 15 mm short axis image 78. Peripancreatic, periportal and portal caval adenopathy up to 19 mm short axis image 62 and 16 mm image 56. Reproductive: Significant prostatic enlargement gland measuring 6.1 x 5.4 cm image 113. Other: Small amount of free fluid in pelvis. Significant edema in the mesenteric associated with mesenteric adenopathy. Multiple tumor nodules/implants in retroperitoneal fat in the flanks bilaterally. Possible peritoneal nodules anterior pelvis image 100 and LEFT pelvis image 94. BILATERAL inguinal hernias containing fat. No free air. Musculoskeletal: No acute osseous findings. Lytic metastatic lesions at L2, L3, L5, LEFT iliac. Probable metastatic lesion S2 segment of sacrum. IMPRESSION: Extensive tumor involvement in the chest, abdomen, and pelvis, with extensive adenopathy in the mediastinum and RIGHT hilum, mesenteric, periportal/peripancreatic. Small BILATERAL pulmonary nodules question pulmonary metastases. Lytic metastatic lesions in lumbar spine and pelvis. Small hepatic and adrenal metastases as well as retroperitoneal tumor deposits bilaterally and question of peritoneal deposits in the pelvis. No definite primary tumor is localized. Small BILATERAL pleural effusions. Electronically Signed   By: Lavonia Dana M.D.   On: 12/02/2016 18:27   Mr Jeri Cos WR Contrast  Result Date: 12/12/2016 CLINICAL DATA:  Initial staging of metastatic cancer. EXAM: MRI HEAD WITHOUT AND WITH CONTRAST TECHNIQUE: Multiplanar, multiecho pulse  sequences of the brain and surrounding structures were obtained without and with intravenous contrast. CONTRAST:  55mL MULTIHANCE GADOBENATE DIMEGLUMINE 529 MG/ML IV SOLN COMPARISON:  None. FINDINGS: Brain: There is no evidence of acute infarct, intracranial hemorrhage, midline shift, or extra-axial fluid collection. There is moderate generalized cerebral atrophy. Scattered foci of cerebral white matter T2 hyperintensity are nonspecific but compatible with mild chronic small vessel ischemic disease. There is a 9 mm enhancing lesion with minimal surrounding edema in the right cerebellar hemisphere (series 12, image 36). A 6 mm enhancing lesion in the medial left occipital lobe also has minimal surrounding edema (series 12, image 72). There is a 3 mm enhancing lesion in the posterior left frontal operculum without edema (series 12, image 88). Vascular: Major intracranial vascular flow voids are preserved. Skull and upper cervical spine: Approximately 2 cm lesion in the clivus, predominantly right of midline. Additional lesion involving C2 on the left. Sinuses/Orbits: Bilateral cataract extraction. No significant sinus disease. Other: None. IMPRESSION: 1. Three subcentimeter enhancing brain lesions consistent with metastases. Minimal edema. 2. Osseous metastases in the clivus and C2. 3. Mild chronic small vessel ischemic disease and moderate cerebral atrophy. Electronically Signed   By: Logan Bores  M.D.   On: 12/12/2016 11:34   US Abdomen Complete  Result Date: 12/12/2016 CLINICAL DATA:  Abdominal pain and belching since this morning. History of metastatic lung cancer. EXAM: ABDOMEN ULTRASOUND COMPLETE COMPARISON:  PET-CT 12/12/2016. FINDINGS: Gallbladder: Gallbladder sludge without gallstones or wall thickening visualized. No sonographic Murphy sign noted by sonographer. Common bile duct: Diameter: 5 mm, within normal limits. Liver: Normal background parenchymal echogenicity. 2 of the known liver metastases were  visualized on this ultrasound, the larger measuring 1.4 cm. Slight central intrahepatic biliary dilatation. IVC: No abnormality visualized. Pancreas: Obscured by bowel gas. Spleen: Size and appearance within normal limits. Right Kidney: Length: 10.1 cm. Echogenicity within normal limits. No mass or hydronephrosis visualized. Left Kidney: Length: 11.3 cm. Echogenicity within normal limits. No hydronephrosis. Two cysts measure 4.5 cm and 1.5 cm. Abdominal aorta: No aneurysm in the proximal abdominal aorta. Distal aorta and bifurcation were obscured by bowel gas. Other findings: Partially visualized right pleural effusion. Trace perihepatic free fluid. IMPRESSION: 1. Gallbladder sludge. No gallstones or evidence of acute cholecystitis. 2. Small liver masses consistent with known metastases. 3. No hydronephrosis. 4. Right pleural effusion.  Trace ascites. Electronically Signed   By: Logan Bores M.D.   On: 12/06/2016 17:42   Ct Abdomen Pelvis W Contrast  Result Date: 12/02/2016 CLINICAL DATA:  Abnormal ultrasound showing liver masses, epigastric discomfort due to gas, lays, 15 pounds weight loss, loss of appetite, generalized weakness, type II diabetes mellitus, hypertension, former smoker EXAM: CT CHEST, ABDOMEN, AND PELVIS WITH CONTRAST TECHNIQUE: Multidetector CT imaging of the chest, abdomen and pelvis was performed following the standard protocol during bolus administration of intravenous contrast. Sagittal and coronal MPR images reconstructed from axial data set. CONTRAST:  53mL ISOVUE-300 IOPAMIDOL (ISOVUE-300) INJECTION 61% IV. Dilute oral contrast. COMPARISON:  Abdominal ultrasound 12/02/2016 FINDINGS: CT CHEST FINDINGS Cardiovascular: Atherosclerotic calcifications aorta, coronary arteries, proximal great vessels. Aorta normal caliber. Small pericardial effusion. Mild LEFT ventricular dilatation. Displacement and compression of the IVC by mediastinal adenopathy the remains patent. Mediastinum/Nodes:  Moderate-sized hiatal hernia question mild wall thickening of the midesophagus. Multiple enlarged mediastinal lymph nodes including a conglomerate nodal mass at the AP window 3.3 x 4.6 cm, pretracheal node 16 mm image 21, prevascular node 16 mm image 16, RIGHT superior mediastinal node 21 mm image 14, and necrotic subcarinal node 29 mm image 28. 17 mm RIGHT hilar node image 31. RIGHT superior hilar node 16 mm image 27. Adenopathy extends throughout the superior mediastinum on the RIGHT, displacing and compressing the IVC and extending to the base of the RIGHT cervical region. No axillary adenopathy. Unable to assess patency of the RIGHT subclavian vein. Lungs/Pleura: Small BILATERAL pleural effusions. Abnormal soft tissue/tumor surrounds the RIGHT and LEFT mainstem bronchi. Underlying emphysematous changes. Dependent atelectasis in the posterior lower lobes. No definite pulmonary mass. 4 mm RIGHT upper lobe nodule image 33. 3 mm RIGHT upper lobe nodule image 63. Calcified granuloma anterior RIGHT lung. Few additional scattered tiny BILATERAL pulmonary nodules. 6 mm LEFT upper lobe nodule image 25. No infiltrate or pneumothorax. Musculoskeletal: No acute osseous lesions. CT ABDOMEN PELVIS FINDINGS Hepatobiliary: Subtle liver lesions question metastases including an 11 mm LEFT lobe lesion image 52, 9 mm RIGHT lobe lesion image 55, LEFT lobe lesion superiorly 8 mm image 50. Gallbladder unremarkable. Pancreas: Mild peripancreatic edema. Peripancreatic adenopathy. No discrete pancreatic mass or ductal dilatation. Spleen: Normal appearance Adrenals/Urinary Tract: BILATERAL adrenal nodules 17 x 12 mm LEFT and 9 x 6 mm RIGHT. LEFT renal cysts largest  4.5 x 3.4 cm image 72. Kidneys, ureters, and bladder otherwise normal appearance. Stomach/Bowel: Scattered stool throughout colon. Sigmoid diverticulosis without evidence of diverticulitis. No definite gastric or bowel mass. Normal appendix. Vascular/Lymphatic: Atherosclerotic  calcifications aorta without aneurysm. Extensive adenopathy and mesenteric up to 15 mm short axis image 78. Peripancreatic, periportal and portal caval adenopathy up to 19 mm short axis image 62 and 16 mm image 56. Reproductive: Significant prostatic enlargement gland measuring 6.1 x 5.4 cm image 113. Other: Small amount of free fluid in pelvis. Significant edema in the mesenteric associated with mesenteric adenopathy. Multiple tumor nodules/implants in retroperitoneal fat in the flanks bilaterally. Possible peritoneal nodules anterior pelvis image 100 and LEFT pelvis image 94. BILATERAL inguinal hernias containing fat. No free air. Musculoskeletal: No acute osseous findings. Lytic metastatic lesions at L2, L3, L5, LEFT iliac. Probable metastatic lesion S2 segment of sacrum. IMPRESSION: Extensive tumor involvement in the chest, abdomen, and pelvis, with extensive adenopathy in the mediastinum and RIGHT hilum, mesenteric, periportal/peripancreatic. Small BILATERAL pulmonary nodules question pulmonary metastases. Lytic metastatic lesions in lumbar spine and pelvis. Small hepatic and adrenal metastases as well as retroperitoneal tumor deposits bilaterally and question of peritoneal deposits in the pelvis. No definite primary tumor is localized. Small BILATERAL pleural effusions. Electronically Signed   By: Lavonia Dana M.D.   On: 12/02/2016 18:27   Dg Esophagus  Result Date: 12/19/2016 CLINICAL DATA:  Dysphagia. EXAM: ESOPHOGRAM/BARIUM SWALLOW TECHNIQUE: Single contrast examination was performed using  thin barium. FLUOROSCOPY TIME:  Fluoroscopy Time:  2.6 minutes Radiation Exposure Index (if provided by the fluoroscopic device): 37.6 mGy COMPARISON: CT scan of the chest dated 12/12/2016 FINDINGS: The patient ingested several sips of thin barium. There is irregular narrowing of the esophagus just below thoracic inlet consistent with the mediastinal tumor present on the prior CT scan. However, there is no obstruction.  The patient has a large hiatal hernia. The patient silently aspirated a small amount of thin barium with each swallow and therefore the study is limited. IMPRESSION: 1. Silent aspiration with each swallow of barium. 2. Irregular narrowing of the thoracic esophagus beginning just below the thoracic inlet. No severe stricture although the esophagus could not be distended because of the aspiration. Barium tablet was not attempted because of the aspiration. 3. Large hiatal hernia. Electronically Signed   By: Lorriane Shire M.D.   On: 12/19/2016 15:25   Nm Pet Image Initial (pi) Skull Base To Thigh  Result Date: 12/12/2016 CLINICAL DATA:  Initial treatment strategy for lung cancer. EXAM: NUCLEAR MEDICINE PET SKULL BASE TO THIGH TECHNIQUE: 12.7 mCi F-18 FDG was injected intravenously. Full-ring PET imaging was performed from the skull base to thigh after the radiotracer. CT data was obtained and used for attenuation correction and anatomic localization. FASTING BLOOD GLUCOSE:  Value: 135 mg/dl COMPARISON:  CT scans dated 12/02/2016 FINDINGS: NECK The patient has known intracranial metastatic lesions are not well observed although the clivus metastatic focus is seen. Left station IIa lymph node measuring 9 mm in short axis on image 33/ 3 has maximum standard uptake value 4.7. Right level III lymph node about the level of the cricoid measures 7 mm in short axis on image 46/3 with maximum SUV 7.2. Left level IV masses extending into the paraspinal space and also extending into the supraclavicular space, with a component posteriorly having maximum SUV of 12.5. Left level V lymph node measures 1.2 cm in short axis on image 36/ 3 with maximum SUV 3.9. Small subcutaneous hypermetabolic lymph  node along the left lower neck measures 8 mm in short axis on image 34/ 3 with maximum SUV 3.2. CHEST Extensive confluent supraclavicular, right paratracheal, left paratracheal, paraesophageal, anterior mediastinal, AP window, bilateral  hilar, subcarinal, and right infrahilar adenopathy noted along with some mildly hypermetabolic left internal mammary adenopathy. Index right lower paratracheal node 1.6 cm in short axis on image 83/3 with maximum SUV 12.2. A small immediately subcutaneous left axillary lymph node measures 1.0 cm in short axis on image 79/3 with maximum SUV 5.5. Small bilateral pleural effusions are present. There is associated passive atelectasis. Severe centrilobular emphysema. A 3 mm right apical pulmonary nodule on image 67/3 is not hypermetabolic but is well below sensitive PET-CT size thresholds. Moderate-sized hiatal hernia. Cardiomegaly with small pericardial effusion. ABDOMEN/PELVIS Several faint hypermetabolic foci are present in the liver suspicious for metastatic disease. These are poorly correlated on the CT data, but an inferior right hepatic lobe lesion has a maximum SUV of 6.0 near the right hepatic tip, well above background liver activity of approximately 3.3. There is extensive hypermetabolic porta hepatis, peripancreatic, mesenteric, and periaortic adenopathy along with scattered hypermetabolic nodules in the posterior perirenal space. An index peripancreatic lymph node measuring 1.5 cm in short axis on image 139/3 (Previously measured 1.6 cm) has a maximum SUV of 9.2. Bilateral small adrenal masses are present, right adrenal gland maximum SUV 7.0, left adrenal gland maximum SUV 8.9. Pelvic ascites noted with several small hypermetabolic peritoneal nodules in the pelvis. Prostatomegaly. Aortoiliac atherosclerotic vascular disease. Sigmoid colon diverticulosis. SKELETON Musculoskeletal hypermetabolic lesions include the clivus, left C2 vertebra, left proximal humerus, right fifth rib, L2 vertebral body and pedicle, right L3 transverse process, left L5 vertebral body, the S1 vertebra and lower sacral regions, both iliac bones, the left anterior inferior acetabulum, the right proximal femur adjacent to the lesser  trochanter, and several questionable foci in the gluteus maximus muscles. Several other more subtle rib and vertebral lesions are present. Index metastatic lesion eccentric to the left the L5 vertebral level has maximum SUV 8.3. IMPRESSION: 1. Extensive widespread metastatic disease including the neck, mediastinum, hila, porta hepatis, mesentery, retroperitoneum, adrenal glands, liver, subcutaneous nodal involvement in several locations, and scattered osseous metastatic disease, including the clivus. 2. Other imaging findings of potential clinical significance: Small bilateral pleural effusions with passive atelectasis. Small pericardial effusion. Cardiomegaly. Moderate-sized hiatal hernia. Aortic Atherosclerosis (ICD10-I70.0) and Emphysema (ICD10-J43.9). Prostatomegaly. Sigmoid colon diverticulosis. Electronically Signed   By: Van Clines M.D.   On: 12/12/2016 12:02   US Biopsy  Result Date: 12/10/2016 CLINICAL DATA:  Lymphadenopathy in the right supraclavicular region, chest and abdomen. Additional liver lesions, lytic bone lesions and pulmonary nodules. The patient presents for biopsy of an enlarged right supraclavicular lymph node. EXAM: ULTRASOUND GUIDED CORE BIOPSY OF RIGHT SUPRACLAVICULAR LYMPH NODE MASS MEDICATIONS: 1.0 mg IV Versed; 50 mcg IV Fentanyl Total Moderate Sedation Time: 19 minutes. The patient's level of consciousness and physiologic status were continuously monitored during the procedure by Radiology nursing. PROCEDURE: The procedure, risks, benefits, and alternatives were explained to the patient. Questions regarding the procedure were encouraged and answered. The patient understands and consents to the procedure. A time out was performed prior to initiating the procedure. The right neck was prepped with chlorhexidine in a sterile fashion, and a sterile drape was applied covering the operative field. A sterile gown and sterile gloves were used for the procedure. Local anesthesia was  provided with 1% Lidocaine. Under ultrasound guidance, a total of 5 separate 18  gauge core biopsy samples were obtained of an enlarged right supraclavicular lymph node. Three samples were submitted in formalin. Two samples were submitted on Telfa soaked with sterile saline. COMPLICATIONS: None. FINDINGS: An enlarged right supraclavicular lymph node is irregular in shape and measures roughly 4.1 x 2.1 x 4.1 cm. Solid tissue was obtained from different portions of the lymph node. IMPRESSION: Ultrasound-guided core biopsy performed of an enlarged right supraclavicular lymph node. Electronically Signed   By: Aletta Edouard M.D.   On: 12/10/2016 14:41   US Abdomen Limited Ruq  Result Date: 12/02/2016 CLINICAL DATA:  Abdominal pain and belching for the past month. 15 pound weight loss over the past month. Ex-smoker. EXAM: ULTRASOUND ABDOMEN LIMITED RIGHT UPPER QUADRANT COMPARISON:  None. FINDINGS: Gallbladder: No gallstones or wall thickening visualized. No sonographic Murphy sign noted by sonographer. Common bile duct: Diameter: 3.7 mm Liver: Multiple rounded and oval hypoechoic masses within the liver. The largest mass measures 1.5 cm in maximum diameter. IMPRESSION: Multiple liver masses, most likely representing metastatic disease. Otherwise, normal examination. Electronically Signed   By: Claudie Revering M.D.   On: 12/02/2016 14:31     Assessment and plan- Patient is a 82 y.o. 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 He has widely metastatic Stage IV lung cancer. Without treatment his prognosis is likely < 3 months. With treatment his life expectancy could be potentially 1-2 years depending on his response to treatment. However, treatment in his case would be challenging given his age and poor nutritional status and  worsening dysphagia.  Nutritional status needs to be addressed prior to any treatment. Barium swallow showed silent aspiration as well as mediastinal tumor possible causing extrinsic compression. palliative RT could be tried to those nodes. However, patient unable to lay flat this time and this can be re assessed next week. He needs some form of nutrition if chemotherapy is being considered and I would favor PEG tube over TPN if feasible. GI will be seeing him shortly to address this issue.   Patient also has brain mets and needs WBRT ideally before starting systemic chemotherapy but he is unable to lay flat for the same  Chemotherapy although can prolong his life, he needs to have a good performance status to get treatment and that remains questionable at this time  Patient during my conversation wished to proceed with treatment.   Also Discussed CODE status with him and he was still thinking about DNR  I have been informed that shortly after I left the room, patient changed his mind and does not wish to pursue any treatment. He wishes to be kept comfortable and opt for hospice. Given his poor PS and widely metastatic disease, this remains a reasonable approach at this time  I will see the patient again tomorrow. Appreciate palliative care input. Patients constipation needs to be addressed at some point given no BM in 2 weeks   Visit Diagnosis 1. Abdominal pain   2. Dysphagia      Dr. Randa Evens, MD, MPH [facility] at [facility] Pager- [phone] 12/20/2016 4:33 PM

## 2016-12-20 NOTE — Progress Notes (Signed)
On call MD, Dr. Margaretmary Eddy, notified by this RN that pt has received multiple doses of medications but is still short of breath and uncomfortable. Primary RN, Robyn, notified Dr. Margaretmary Eddy to place order for Morphine drip.

## 2016-12-20 NOTE — Progress Notes (Signed)
Family Meeting Note  Advance Directive:yes  Today a meeting took place with the Patient, spouse and son, daughter, daughter in law.   The following clinical team members were present during this meeting:MD  The following were discussed:Patient's diagnosis: metastatic lung cancer, severe dysphagia, respi distress , Patient's progosis: < 4 weeks and Goals for treatment: DNR  Additional follow-up to be provided: Comfort measures, hospice.  Time spent during discussion:20 minutes  Keith Chandler, Rosalio Macadamia, MD

## 2016-12-20 NOTE — Consult Note (Signed)
Keith Bellows MD, MRCP(U.K) [address]  [place]  [place], [place] [address]  Main: [phone]  Fax: [phone]  Consultation  Referring Provider:     Anselm Chandler  Primary Care Physician:  Center, [facility] Primary Gastroenterologist:  None          Reason for Consultation:     Dysphagia  Date of Admission:  11/30/2016 Date of Consultation:  12/20/2016         HPI:   Keith Chandler is a 82 y.o. male with metastatic lung cancer admitted on 12/18/16 for poor oral intake. He has metastasis to the neck , mediastinum, liver, b/l effusions , brain mets.   Barium swallow done yesterday shows silent aspiration with each swallow of barium , irregular narrowing of the thoracic esophagus , no stricture seen . Large hiatal hernia.     Past Medical History:  Diagnosis Date  . Diabetes mellitus without complication ([place])   . Hypertension   . Neuropathy     Past Surgical History:  Procedure Laterality Date  . CATARACT EXTRACTION, BILATERAL      Prior to Admission medications   Medication Sig Start Date End Date Taking? Authorizing Provider  aspirin EC 81 MG tablet Take 81 mg by mouth daily.    [provider]  atorvastatin (LIPITOR) 10 MG tablet Take 10 mg by mouth daily.    [provider]  dexamethasone (DECADRON) 4 MG tablet Take 1 tablet (4 mg total) by mouth daily. 12/13/16   Lloyd Huger, MD  fentaNYL (DURAGESIC - DOSED MCG/HR) 25 MCG/HR patch Place 1 patch (25 mcg total) onto the skin every 3 (three) days. 12/02/2016   Jacquelin Hawking, NP  folic acid (FOLVITE) 1 MG tablet Take 1 tablet (1 mg total) by mouth daily. 12/15/16   Lloyd Huger, MD  gabapentin (NEURONTIN) 600 MG tablet Take 600 mg by mouth 3 (three) times daily as needed.    [provider]  HYDROcodone-acetaminophen (NORCO) 5-325 MG tablet Take 1 tablet by mouth every 6 (six) hours as needed for moderate pain. 11/20/2016   Jacquelin Hawking, NP  lidocaine-prilocaine  (EMLA) cream Apply to affected area once 12/15/16   Lloyd Huger, MD  megestrol (MEGACE) 40 MG tablet Take 1 tablet (40 mg total) by mouth daily. 11/18/2016   Jacquelin Hawking, NP  omeprazole (PRILOSEC) 20 MG capsule Take 1 capsule (20 mg total) by mouth 2 (two) times daily before a meal. 12/03/16   Fritzi Mandes, MD  ondansetron (ZOFRAN) 8 MG tablet Take 1 tablet (8 mg total) by mouth every 8 (eight) hours as needed. 11/20/2016   Jacquelin Hawking, NP  prochlorperazine (COMPAZINE) 10 MG tablet Take 1 tablet (10 mg total) by mouth every 6 (six) hours as needed (Nausea or vomiting). 11/27/2016   Jacquelin Hawking, NP    Family History  Problem Relation Age of Onset  . Family history unknown: Yes     Social History  Substance Use Topics  . Smoking status: Former Smoker    Packs/day: 2.00    Years: 45.00    Types: Cigarettes  . Smokeless tobacco: Former Systems developer    Quit date: 05/21/1992  . Alcohol use No    Allergies as of 11/19/2016 - Review Complete 12/16/2016  Allergen Reaction Noted  . Sulfa antibiotics  12/02/2016    Review of Systems:    All systems reviewed and negative except where noted in HPI.   Physical Exam:  Vital signs in last 24 hours: Temp:  [97.6 F (36.4 C)-98.4 F (36.9 C)] 97.6 F (36.4 C) (08/02 0702) Pulse Rate:  [96-114] 96 (08/02 0702) Resp:  [19-22] 22 (08/02 0702) BP: (103-139)/(76-84) 103/84 (08/02 0702) SpO2:  [96 %-97 %] 96 % (08/02 0702) FiO2 (%):  [4 %] 4 % (08/02 0702) Last BM Date: 01/10/17 General:  Appeared very short of breath , was unable to speak more than a few words before he would go short of breath. He was leaning forward to get some air.   LAB RESULTS:  Recent Labs  12/12/2016 1120  WBC 11.4*  HGB 8.9*  HCT 26.7*  PLT 304   BMET  Recent Labs  12/10/2016 1120 12/18/16 0445  NA 141 146*  K 3.6 3.7  CL 108 115*  CO2 22 24  GLUCOSE 167* 128*  BUN 34* 33*  CREATININE 1.40* 1.21  CALCIUM 8.7* 8.1*   LFT  Recent Labs   11/22/2016 1120  PROT 6.9  ALBUMIN 3.2*  AST 20  ALT 14*  ALKPHOS 59  BILITOT 0.6   PT/INR No results for input(s): LABPROT, INR in the last 72 hours.  STUDIES: Dg Esophagus  Result Date: 12/19/2016 CLINICAL DATA:  Dysphagia. EXAM: ESOPHOGRAM/BARIUM SWALLOW TECHNIQUE: Single contrast examination was performed using  thin barium. FLUOROSCOPY TIME:  Fluoroscopy Time:  2.6 minutes Radiation Exposure Index (if provided by the fluoroscopic device): 37.6 mGy COMPARISON: CT scan of the chest dated 12/12/2016 FINDINGS: The patient ingested several sips of thin barium. There is irregular narrowing of the esophagus just below thoracic inlet consistent with the mediastinal tumor present on the prior CT scan. However, there is no obstruction. The patient has a large hiatal hernia. The patient silently aspirated a small amount of thin barium with each swallow and therefore the study is limited. IMPRESSION: 1. Silent aspiration with each swallow of barium. 2. Irregular narrowing of the thoracic esophagus beginning just below the thoracic inlet. No severe stricture although the esophagus could not be distended because of the aspiration. Barium tablet was not attempted because of the aspiration. 3. Large hiatal hernia. Electronically Signed   By: Lorriane Shire M.D.   On: 12/19/2016 15:25      Impression / Plan:   Keith Chandler is a 82 y.o. y/o male with metastatic lung cancer. I have been consulted for dysphagia. Barium swallow suggests silent aspiration with no clear evidence of a luminal abnormality of the esophagus. Known to have metastasis in the neck, In addition to a oropharyngeal transfer dysphagia may have external compression of the esophagus from metastasis.  Options  involve NG tube feeding vs PEG tube vs obtaining SLP consult to see if thickening of foods makes it less likely to aspirate. It is very likely possible that the transfer dysphagia is from tumor affecting the nerves of deglutition.     When I walked into the room he was appearing short of breath , was not able to lay flat . He was  In the company of a lot of the family members. While I was discussing the various options , he started to say that he didn't want any procedures and wanted to be made comfortable. The family were all in tears at that time. I felt it was appropriate to give them some space. I did speak witth his daughter in law who suggested that they wouldn't want a PEG at this time or any procedures. I advised to call me back if needed.  Discussed plan with Dr Marthann Schiller and Dr Janese Banks   I will sign off.  Please call me if any further GI concerns or questions.  We would like to thank you for the opportunity to participate in the care of Aundra Millet.    Thank you for involving me in the care of this patient.      LOS: 3 days   Keith Bellows, MD  12/20/2016, 10:30 AM

## 2016-12-20 NOTE — Progress Notes (Signed)
CH made a follow up visit with family in hallway. Daughter and son both stated that the Pt is having a rough time. The family is worried and remaining strong. CH is available for follow up and support if needed.    12/20/16 1300  Clinical Encounter Type  Visited With Family  Visit Type Follow-up  Consult/Referral To Chaplain  Spiritual Encounters  Spiritual Needs Emotional

## 2016-12-20 NOTE — Progress Notes (Signed)
Called and spoke with the patient's nurse Robyn to see if he would be able to attempt his simulation for radiation today.  Pt stated that he would not be able to lay flat for 15 minutes.  We will check back on Monday to see if there are any changes.

## 2016-12-20 NOTE — Progress Notes (Signed)
[place] at [place] NAME: Keith Chandler    MR#:  [id number]  DATE OF BIRTH:  1934-07-24  SUBJECTIVE:  CHIEF COMPLAINT:  No chief complaint on file.   The patient was recently diagnosed with metastatic lung cancer- metastases to brain, bones, liver, mediastinal lymph nodes- has difficulty swallowing food which is progressively getting worse up to the point that he cannot swallow liquids now.   Failed barium swellow study with aspirations yesterday.   Have more laboured breathing today. Lost IV line so received PICC line earlier.  REVIEW OF SYSTEMS:  CONSTITUTIONAL: No fever,positive for fatigue or weakness.  EYES: No blurred or double vision.  EARS, NOSE, AND THROAT: No tinnitus or ear pain.  RESPIRATORY: No cough, have shortness of breath, no wheezing or hemoptysis.  CARDIOVASCULAR: No chest pain, orthopnea, edema.  GASTROINTESTINAL: No nausea, vomiting, diarrhea or abdominal pain.  GENITOURINARY: No dysuria, hematuria.  ENDOCRINE: No polyuria, nocturia,  HEMATOLOGY: No anemia, easy bruising or bleeding SKIN: No rash or lesion. MUSCULOSKELETAL: No joint pain or arthritis.   NEUROLOGIC: No tingling, numbness, weakness.  PSYCHIATRY: No anxiety or depression.   ROS  DRUG ALLERGIES:   Allergies  Allergen Reactions  . Sulfa Antibiotics     VITALS:  Blood pressure 103/84, pulse 96, temperature 97.6 F (36.4 C), temperature source Oral, resp. rate (!) 22, height 5\' 6"  (1.676 m), weight 82.3 kg (181 lb 8 oz), SpO2 96 %.  PHYSICAL EXAMINATION:  GENERAL:  82 y.o.-year-old patient lying in the bed with no acute distress.  EYES: Pupils equal, round, reactive to light and accommodation. No scleral icterus. Extraocular muscles intact.  HEENT: Head atraumatic, normocephalic. Oropharynx and nasopharynx clear.  NECK:  Supple, no jugular venous distention. No thyroid enlargement, no tenderness.  LUNGS: shallow and fast breath sounds bilaterally,  no wheezing, some crepitation. positive use of accessory muscles of respiration.  CARDIOVASCULAR: S1, S2 normal. No murmurs, rubs, or gallops.  ABDOMEN: Soft, nontender, nondistended. Bowel sounds present. No organomegaly or mass.  EXTREMITIES: No pedal edema, cyanosis, or clubbing.  NEUROLOGIC: Cranial nerves II through XII are intact. Muscle strength 5/5 in all extremities. Sensation intact. Gait not checked.  PSYCHIATRIC: The patient is alert and oriented x 3.  SKIN: No obvious rash, lesion, or ulcer.   Physical Exam LABORATORY PANEL:   CBC  Recent Labs Lab 12/01/2016 1120  WBC 11.4*  HGB 8.9*  HCT 26.7*  PLT 304   ------------------------------------------------------------------------------------------------------------------  Chemistries   Recent Labs Lab 12/01/2016 1120 11/30/2016 1525 12/18/16 0445  NA 141  --  146*  K 3.6  --  3.7  CL 108  --  115*  CO2 22  --  24  GLUCOSE 167*  --  128*  BUN 34*  --  33*  CREATININE 1.40*  --  1.21  CALCIUM 8.7*  --  8.1*  MG  --  2.7*  --   AST 20  --   --   ALT 14*  --   --   ALKPHOS 59  --   --   BILITOT 0.6  --   --    ------------------------------------------------------------------------------------------------------------------  Cardiac Enzymes No results for input(s): TROPONINI in the last 168 hours. ------------------------------------------------------------------------------------------------------------------  RADIOLOGY:  Dg Esophagus  Result Date: 12/19/2016 CLINICAL DATA:  Dysphagia. EXAM: ESOPHOGRAM/BARIUM SWALLOW TECHNIQUE: Single contrast examination was performed using  thin barium. FLUOROSCOPY TIME:  Fluoroscopy Time:  2.6 minutes Radiation Exposure Index (if provided by the fluoroscopic device): 37.6  mGy COMPARISON: CT scan of the chest dated 12/12/2016 FINDINGS: The patient ingested several sips of thin barium. There is irregular narrowing of the esophagus just below thoracic inlet consistent with the  mediastinal tumor present on the prior CT scan. However, there is no obstruction. The patient has a large hiatal hernia. The patient silently aspirated a small amount of thin barium with each swallow and therefore the study is limited. IMPRESSION: 1. Silent aspiration with each swallow of barium. 2. Irregular narrowing of the thoracic esophagus beginning just below the thoracic inlet. No severe stricture although the esophagus could not be distended because of the aspiration. Barium tablet was not attempted because of the aspiration. 3. Large hiatal hernia. Electronically Signed   By: Lorriane Shire M.D.   On: 12/19/2016 15:25    ASSESSMENT AND PLAN:   Active Problems:   Dysphagia   Adenocarcinoma of lung, stage 4 (HCC)   Abdominal pain   Poor fluid intake   Palliative care encounter  * Dysphagia, poor oral intake and hoarse voice due to Lung cancer stage IV B The patient is admitted directly from radiation clinic to the medical floor. Keep nothing by mouth except medications, speech study, IV fluid support, Zofran when necessary, oncology consult appreciated.  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  Due to terminal cancer GI was also called. They need to discuss the option for feeding and about his prognosis.    Barium swellow study is done and pt have significant aspirations.    Today have some respi distress, so finally pt and family decided for comfort care.  * Abdominal pain. Possible due to metastases, ultrasound of abdomen- without any new findings.  * Lung cancer stage IV B, with bone and brain metastasis.  Continue dexamethasone, oncology consult and palliative care consult.  * Acute renal failure with dehydration. IV fluid support and follow BMP.  * Diabetes. Start sliding scale.  * Iron deficiency anemia:  IV Feraheme    All the records are reviewed and case discussed with Care Management/Social Workerr. Management plans discussed with the patient, family and  they are in agreement.  CODE STATUS: DNR  TOTAL TIME TAKING CARE OF THIS PATIENT: 45 minutes.  I had repeated discussions with pt and family, initially considering feeding tube vs TPN options and later to discuss comfort care.  POSSIBLE D/C IN 2-3 DAYS, DEPENDING ON CLINICAL CONDITION.   Vaughan Basta M.D on 12/20/2016   Between 7am to 6pm - Pager - [phone]  After 6pm go to [email or web address] - password [email or web address] [facility]  Office  [phone]  CC: Primary care physician; [facility], [facility]  Note: This dictation was prepared with Dragon dictation along with smaller phrase technology. Any transcriptional errors that result from this process are unintentional.

## 2016-12-20 NOTE — Progress Notes (Signed)
Peripherally Inserted Central Catheter/Midline Placement  The IV Nurse has discussed with the patient and/or persons authorized to consent for the patient, the purpose of this procedure and the potential benefits and risks involved with this procedure.  The benefits include less needle sticks, lab draws from the catheter, and the patient may be discharged home with the catheter. Risks include, but not limited to, infection, bleeding, blood clot (thrombus formation), and puncture of an artery; nerve damage and irregular heartbeat and possibility to perform a PICC exchange if needed/ordered by physician.  Alternatives to this procedure were also discussed.  Bard Power PICC patient education guide, fact sheet on infection prevention and patient information card has been provided to patient /or left at bedside.    PICC/Midline Placement Documentation  PICC Single Lumen 12/20/16 PICC Right Brachial 37 cm 0 cm (Active)  Indication for Insertion or Continuance of Line Poor Vasculature-patient has had multiple peripheral attempts or PIVs lasting less than 24 hours 12/20/2016  1:35 PM  Exposed Catheter (cm) 0 cm 12/20/2016  1:35 PM  Site Assessment Clean;Dry;Intact 12/20/2016  1:35 PM  Line Status Blood return noted;Flushed;Saline locked 12/20/2016  1:35 PM  Dressing Type Transparent 12/20/2016  1:35 PM  Dressing Status Clean;Dry;Intact;Antimicrobial disc in place 12/20/2016  1:35 PM  Dressing Intervention New dressing 12/20/2016  1:35 PM  Dressing Change Due 12/27/16 12/20/2016  1:35 PM       Aldona Lento L 12/20/2016, 1:56 PM

## 2016-12-20 NOTE — Progress Notes (Signed)
Pt is comfort care, current meds are not keeping him comfortable. We'll start patient on morphine drip for comfort care

## 2016-12-21 ENCOUNTER — Inpatient Hospital Stay: Payer: Medicare Other

## 2016-12-21 ENCOUNTER — Inpatient Hospital Stay: Payer: Medicare Other | Admitting: Oncology

## 2016-12-21 MED ORDER — SODIUM CHLORIDE 0.9 % IV SOLN
INTRAVENOUS | Status: DC
Start: 2016-12-21 — End: 2016-12-21

## 2016-12-21 MED ORDER — LORAZEPAM 2 MG/ML IJ SOLN: 0.5000 mg | INTRAMUSCULAR | Status: DC | PRN

## 2016-12-21 MED ORDER — MORPHINE BOLUS VIA INFUSION
2.0000 mg | INTRAVENOUS | Status: DC | PRN
  Administered 2016-12-21: 4 mg via INTRAVENOUS
  Filled 2016-12-21: qty 4

## 2016-12-21 MED ORDER — SODIUM CHLORIDE 0.9 % IV SOLN: 3.0000 mg/h | INTRAVENOUS | Status: DC

## 2016-12-21 MED ORDER — SCOPOLAMINE 1 MG/3DAYS TD PT72
1.0000 | MEDICATED_PATCH | TRANSDERMAL | Status: DC
  Filled 2016-12-21: qty 1

## 2016-12-21 MED ORDER — SODIUM CHLORIDE 0.9 % IV SOLN
3.0000 mg/h | INTRAVENOUS | Status: DC
  Filled 2016-12-21: qty 10

## 2016-12-21 MED ORDER — LORAZEPAM 2 MG/ML IJ SOLN
2.0000 mg/h | INTRAMUSCULAR | Status: DC
Start: 2016-12-21 — End: 2016-12-21
  Filled 2016-12-21: qty 25

## 2016-12-21 MED ORDER — GLYCOPYRROLATE 0.2 MG/ML IJ SOLN
0.2000 mg | INTRAMUSCULAR | Status: DC
  Filled 2016-12-21 (×4): qty 1

## 2016-12-21 MED ORDER — GLYCOPYRROLATE 0.2 MG/ML IJ SOLN
0.4000 mg | INTRAMUSCULAR | Status: DC
  Administered 2016-12-21: 0.4 mg via INTRAVENOUS
  Filled 2016-12-21 (×4): qty 2

## 2016-12-21 MED ORDER — MORPHINE SULFATE (PF) 4 MG/ML IV SOLN: 4.0000 mg | INTRAVENOUS | Status: DC | PRN

## 2016-12-21 MED ORDER — HYDROMORPHONE HCL 1 MG/ML IJ SOLN
2.0000 mg | INTRAMUSCULAR | Status: AC
  Administered 2016-12-21: 2 mg via INTRAVENOUS
  Filled 2016-12-21: qty 2

## 2016-12-24 ENCOUNTER — Inpatient Hospital Stay: Payer: Medicare Other

## 2017-01-02 ENCOUNTER — Other Ambulatory Visit: Payer: Self-pay | Admitting: Nurse Practitioner

## 2017-01-19 NOTE — Progress Notes (Signed)
Notified by family that pt had passed, upon arrival to room palliative NP at bedside along with family, pt with out pulse and respirations

## 2017-01-19 NOTE — Discharge Summary (Addendum)
Date of death- 01-07-2017  Cause of death- Stage 4 lung cancer  Associated medical problems     Diabetes     Dysphagia and decreased oral intake     Iron deficiency anemia  Hospital course    * Dysphagia, poor oral intake and hoarse voice due toLung cancer stage IV B The patient is admitted directly from radiation clinic to the medical floor. Keep nothing by mouth except medications, speech study, IV fluid support, Zofran when necessary, oncology consult appreciated.  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  Due to terminal cancer GI was also called. They need to discuss the option for feeding and about his prognosis.    Barium swellow study is done and pt have significant aspirations.    Today have some respi distress, so finally pt and family decided for comfort care.  * Abdominal pain. Possible due to metastases, ultrasound of abdomen- without any new findings.  * Lung cancer stage IV B, with bone and brain metastasis.  Continue dexamethasone, oncology consult and palliative care consult.  * Acute renal failure with dehydration. IV fluid support and follow BMP.  * Diabetes. Start sliding scale.  * Iron deficiency anemia:  IV Feraheme   Family agreed on comfort care and pt died next day in hospital.

## 2017-01-19 NOTE — Care Management Important Message (Signed)
Important Message  Patient Details  Name: GEORDAN XU MRN: [id number] Date of Birth: Mar 17, 1935   Medicare Important Message Given:  Yes    Shelbie Ammons, RN 01/07/17, 8:05 AM

## 2017-01-19 NOTE — Progress Notes (Signed)
Palliative Medicine RN Note: Rec'd call from Vinie Sill, NP w PMT to check on pt. Upon my arrival, RN was giving morphine bolus. Pt had copious secretions. RN was waiting on Robinul from pharmacy. Pt remained symptomatic. Spoke with Elmo Putt, who ordered stat dilaudid x1 and ativan drip. RN gave dilaudid. Pt relaxed, was able to smile at family and open his eyes. I stayed with family until pt expired at 58. No pulse, no HR, no breathing. Notified RN Estill Bamberg.   Family sad and crying. Wife is present; family reports she has PM and ATD; they are concerned she needs to see a doctor/will have "heart problems." I explained that we would be happy to take her to the ED but that there is nothing we can do beyond that. Elmo Putt arrived shortly after pt expired to provide additional support.   Family does not know what funeral home they want to use. Offered to bring pt to morgue if they can't decide, but son says they will have an answer before they leave. Family expressed thanks for the nursing care he rec'd.   Marjie Skiff Grantley Savage, RN, BSN, [facility] 11-Jan-2017 11:05 AM Cell [phone] 8:00-4:00 Monday-Friday Office [phone]

## 2017-01-19 NOTE — Progress Notes (Signed)
MD made aware that pt has expired

## 2017-01-19 NOTE — Progress Notes (Signed)
[facility] responded to a referral from RN. Pt passed at 9:30 A. CH met with family members inside and outside of the room. CH provided empathetic listening and prayer. Family has a strong faith base.    05-Jan-2017 1000  Clinical Encounter Type  Visited With Family  Visit Type Follow-up;Death  Referral From Nurse  Spiritual Encounters  Spiritual Needs Prayer;Emotional;Grief support

## 2017-01-19 DEATH — deceased

## 2019-02-24 IMAGING — RF DG ESOPHAGUS
6 series · 14 of 14 positions shown · non-contrast
Comparison: CT scan of the chest dated 12/12/2016

CLINICAL DATA: Dysphagia.

EXAM:
ESOPHOGRAM/BARIUM SWALLOW
TECHNIQUE: Single contrast examination was performed using  thin barium.
FLUOROSCOPY TIME:  Fluoroscopy Time:  2.6 minutes
Radiation Exposure Index (if provided by the fluoroscopic device):
37.6 mGy

[Series 1: fluoro_barium 2fps_bw · 0.20mm/px · 2 of 2 frames shown (1 of 4)]
[frame 1/2]
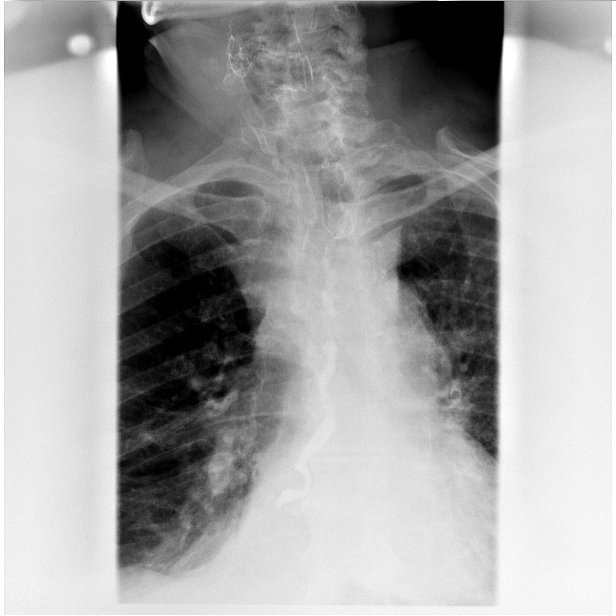
[frame 2/2]
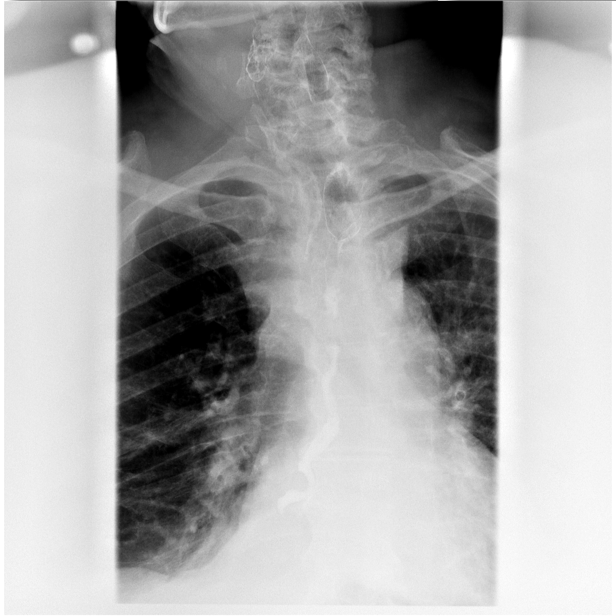

[Series 2: fluoro_barium 2fps_bw · 0.20mm/px · 2 of 2 frames shown (2 of 4)]
[frame 1/2]
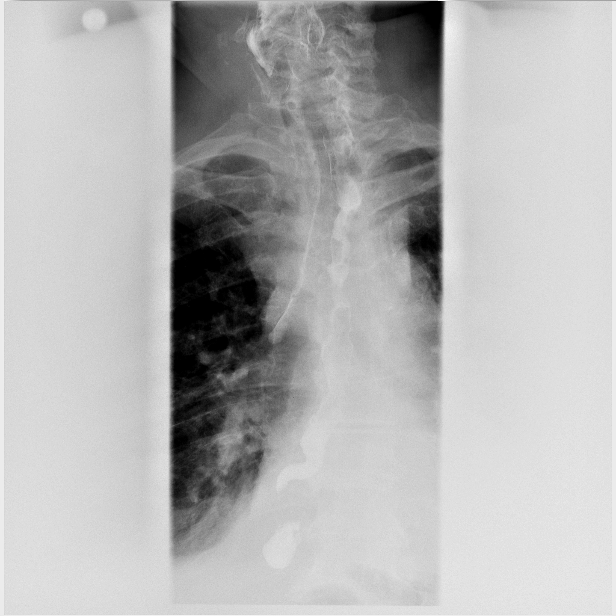
[frame 2/2]
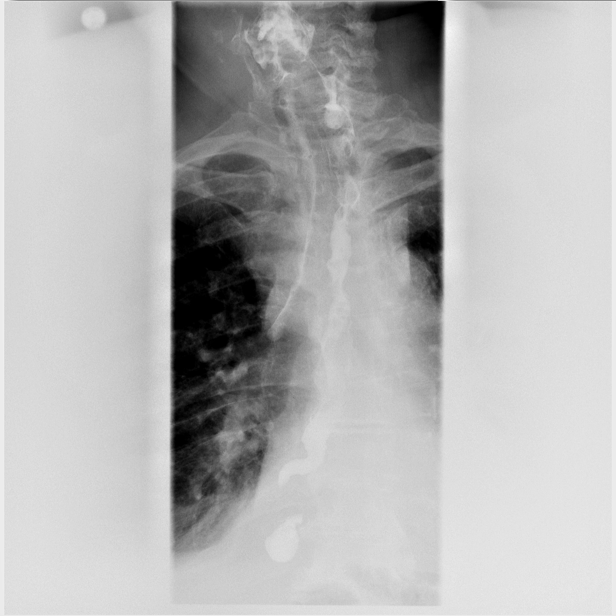

[Series 3: fluoro_barium 2fps_bw · 0.20mm/px · 2 of 2 frames shown (3 of 4)]
[frame 1/2]
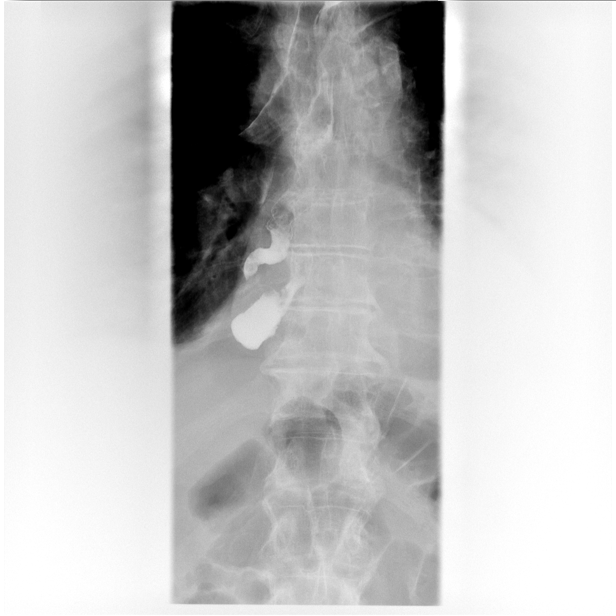
[frame 2/2]
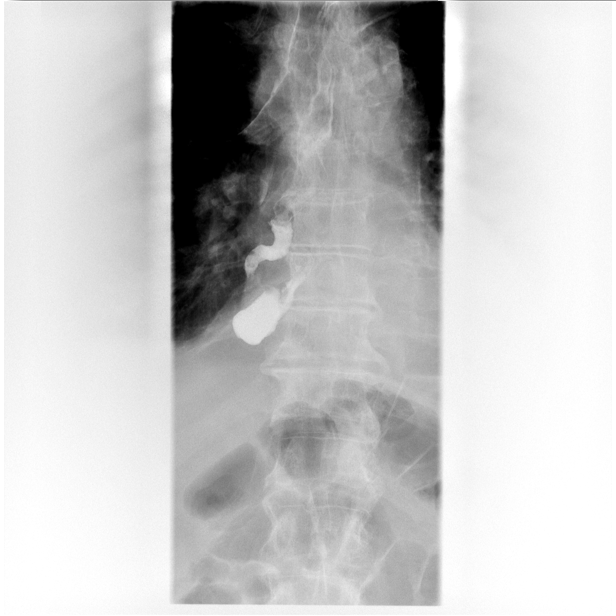

[Series 4: fluoro_barium 2fps_bw · 0.20mm/px · 2 of 2 frames shown (4 of 4)]
[frame 1/2]
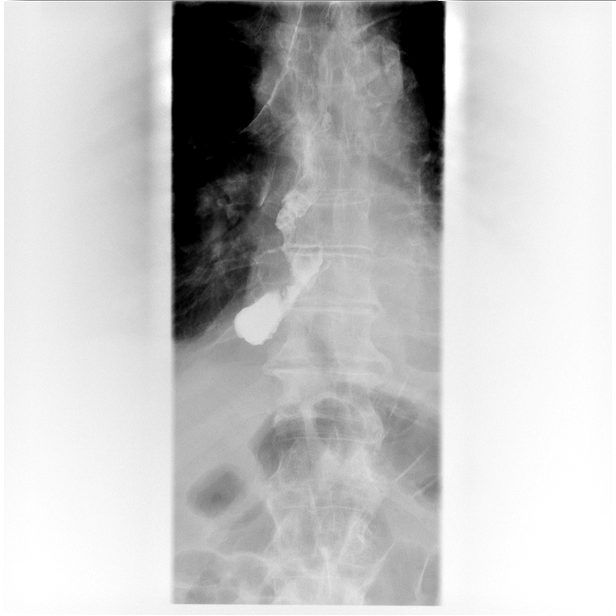
[frame 2/2]
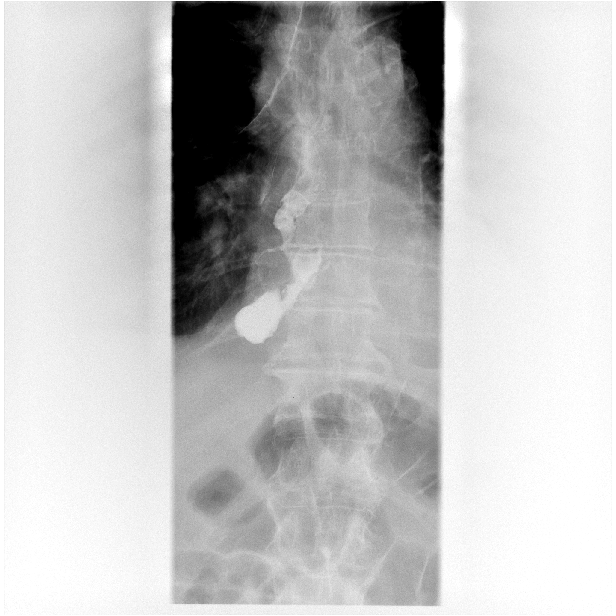

[Series 5: cp_standard · 0.30mm/px · 2 of 2 slices shown (1 of 2)]
[im 1/2]
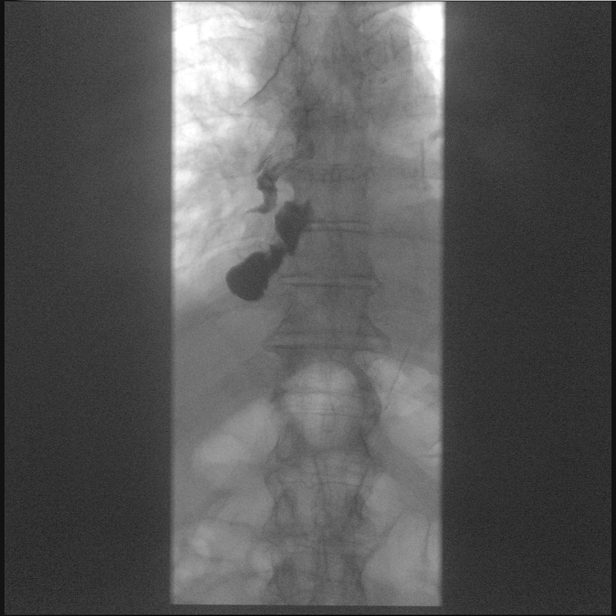
[im 2/2]
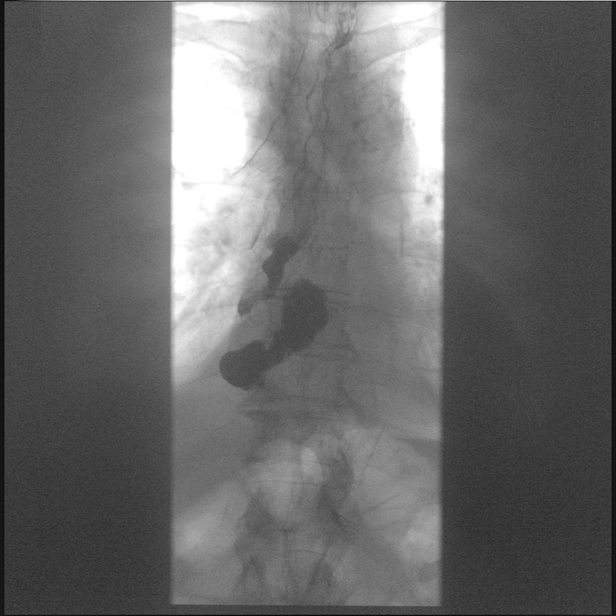

[Series 6: cp_standard · 0.30mm/px · 4 of 151 frames shown (2 of 2)]
[frame 23/151]
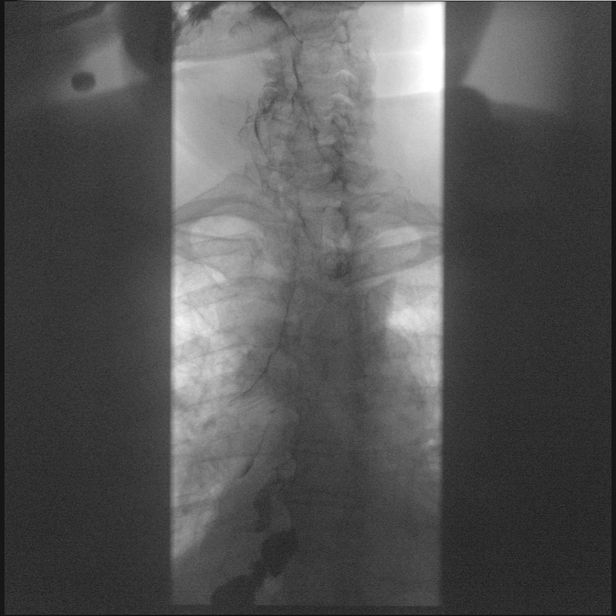
[frame 69/151]
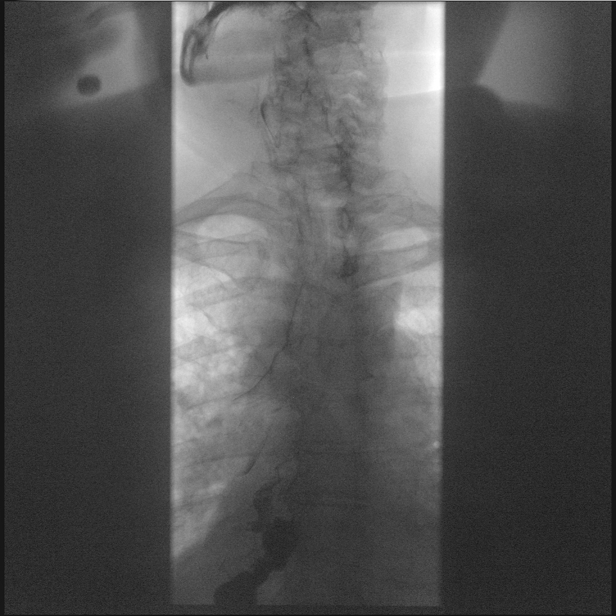
[frame 76/151]
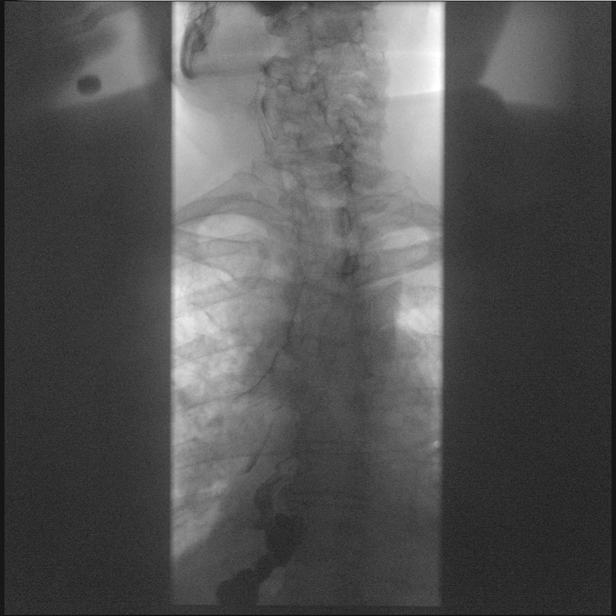
[frame 129/151]
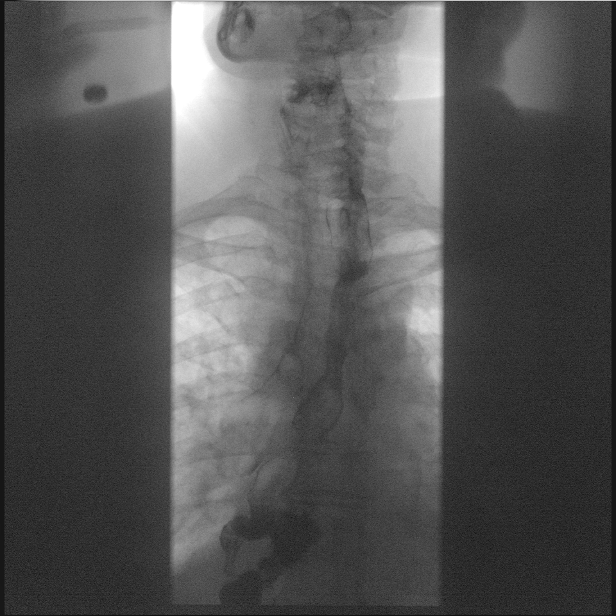

[14 of 14 positions shown; findings below may reference images not displayed]

FINDINGS: The patient ingested several sips of thin barium. There is irregular
narrowing of the esophagus just below thoracic inlet consistent with
the mediastinal tumor present on the prior CT scan. However, there
is no obstruction. The patient has a large hiatal hernia.

The patient silently aspirated a small amount of thin barium with
each swallow and therefore the study is limited.
IMPRESSION: 1. Silent aspiration with each swallow of barium.
2. Irregular narrowing of the thoracic esophagus beginning just
below the thoracic inlet. No severe stricture although the esophagus
could not be distended because of the aspiration. Barium tablet was
not attempted because of the aspiration.
3. Large hiatal hernia.
